# Patient Record
Sex: Male | Born: 1980 | Race: Black or African American | Hispanic: No | Marital: Single
Health system: Midwestern US, Community
[De-identification: ages and names within clinical notes are randomized; demographics above are authoritative.]

## PROBLEM LIST (undated history)

## (undated) DIAGNOSIS — M1990 Unspecified osteoarthritis, unspecified site: Secondary | ICD-10-CM

---

## 2013-09-07 ENCOUNTER — Inpatient Hospital Stay: Admit: 2013-09-07 | Discharge: 2013-09-07 | Disposition: A | Discharge status: Home / Self Care

## 2013-09-07 ENCOUNTER — Emergency Department: Admit: 2013-09-07

## 2013-09-07 DIAGNOSIS — N453 Epididymo-orchitis: Secondary | ICD-10-CM

## 2013-09-07 LAB — URINALYSIS-MACROSCOPIC W/REFLEX TO MICROSCOPIC
Bilirubin, UA: NEGATIVE
Glucose, UA: NEGATIVE mg/dL
Ketones, UA: NEGATIVE mg/dL
Nitrite, UA: NEGATIVE
Protein, UA: NEGATIVE mg/dL
Specific Gravity, UA: 1.01 (ref 1.005–1.035)
Urobilinogen, UA: 0.2 EU/dL (ref 0.2–1.0)
pH, UA: 7 (ref 5.0–8.0)

## 2013-09-07 LAB — URINALYSIS, MICROSCOPIC
RBC, UA: 3 /HPF (ref 0–3)
Squam Epithel, UA: <1 /HPF (ref 0–5)
WBC, UA: 47 /HPF (ref 0–5)

## 2013-09-07 MED ORDER — doxycycline (VIBRA-TABS) 100 MG tablet
100 | ORAL_TABLET | Freq: Two times a day (BID) | ORAL | Status: AC
Start: 2013-09-07 — End: 2013-09-21

## 2013-09-07 MED ORDER — HYDROcodone-acetaminophen (NORCO) 5-325 mg per tablet
5-325 | ORAL_TABLET | Freq: Four times a day (QID) | ORAL | 0.00 refills | 15.50000 days | Status: AC | PRN
Start: 2013-09-07 — End: ?

## 2013-09-07 MED ORDER — cefTRIAXone (ROCEPHIN) injection 250 mg
250 | Freq: Once | INTRAMUSCULAR | Status: AC
Start: 2013-09-07 — End: 2013-09-07
  Administered 2013-09-07: 17:00:00 250 mg via INTRAMUSCULAR

## 2013-09-07 MED ORDER — oxyCODONE-acetaminophen (PERCOCET) 5-325 mg per tablet
5-325 | ORAL | Status: AC
Start: 2013-09-07 — End: 2013-09-07
  Administered 2013-09-07: 14:00:00 1 via ORAL

## 2013-09-07 MED ORDER — metroNIDAZOLE (FLAGYL) tablet 2,000 mg
500 | Freq: Once | ORAL | Status: AC
Start: 2013-09-07 — End: 2013-09-07
  Administered 2013-09-07: 17:00:00 2000 mg via ORAL

## 2013-09-07 MED ORDER — azithromycin (ZITHROMAX) powder 1 g
1 | Freq: Once | ORAL | Status: AC
Start: 2013-09-07 — End: 2013-09-07
  Administered 2013-09-07: 17:00:00 1 g via ORAL

## 2013-09-07 MED ORDER — doxycycline (MONODOX) capsule 100 mg
100 | Freq: Once | ORAL | Status: AC
Start: 2013-09-07 — End: 2013-09-07
  Administered 2013-09-07: 17:00:00 100 mg via ORAL

## 2013-09-07 MED ORDER — oxyCODONE-acetaminophen (PERCOCET) 5-325 mg per tablet 1 tablet
5-325 | Freq: Once | ORAL | Status: AC
Start: 2013-09-07 — End: 2013-09-07

## 2013-09-07 MED FILL — METRONIDAZOLE 500 MG TABLET: 500 500 MG | ORAL | Qty: 4

## 2013-09-07 MED FILL — OXYCODONE-ACETAMINOPHEN 5 MG-325 MG TABLET: 5-325 5-325 mg | ORAL | Qty: 1

## 2013-09-07 MED FILL — AZITHROMYCIN 1 GRAM ORAL PACKET: 1 1 gram | ORAL | Qty: 1

## 2013-09-07 MED FILL — CEFTRIAXONE 250 MG SOLUTION FOR INJECTION: 250 250 mg | INTRAMUSCULAR | Qty: 250

## 2013-09-07 MED FILL — DOXYCYCLINE MONOHYDRATE 100 MG CAPSULE: 100 100 MG | ORAL | Qty: 1

## 2013-09-07 NOTE — Unmapped (Signed)
[place] ED Note    Date of service:  09/07/2013    Reason for Visit: Testicle Swelling      Patient History     HPI  33 year old male presents to the emergency department with testicle swelling.  The patient states that he noticed over the last 2 days swelling in his right testicle associated with pain.  He denies dysuria change in urine color pain with defecation or urination.  He denies fevers.  He states he's never experienced this before but now endorses 8 of 10 right-sided testicular pain that feels like severe pain.  It does not radiate.  The pain is constant but worse with walking and no palliative factors.  The patient states that he is sexually active with women only and has within the last 4 weeks been sexually active without barrier protection.  The patient denies trauma to the area.   History reviewed. No pertinent past medical history.    History reviewed. No pertinent past surgical history.    Patient  reports that he has been smoking.  He does not have any smokeless tobacco history on file. He reports that he drinks alcohol. He reports that he does not use illicit drugs.      Previous Medications    No medications on file       Allergies:   Allergies as of 09/07/2013   ??? (No Known Allergies)       Review of Systems     Review of Systems  All other systems reviewed and negative other than as indicated in the HPI      Physical Exam     ED Triage Vitals   Vital Signs Group      Temp 09/07/13 0940 98.5 ??F (36.9 ??C)      Temp Source 09/07/13 0940 Oral      Heart Rate 09/07/13 0940 94      Heart Rate Source 09/07/13 0940 Automatic      Resp 09/07/13 0940 12      SpO2 09/07/13 0940 98 %      BP 09/07/13 0940 142/83 mmHg      BP Location 09/07/13 0940 Right arm      BP Method 09/07/13 0940 Automatic      Patient Position 09/07/13 0940 Lying   SpO2 09/07/13 0940 98 %   O2 Device 09/07/13 0940 None (Room air)       Physical Exam   Nursing note and vitals  reviewed.  Constitutional: He appears well-developed and well-nourished. No distress.   HENT:   Head: Normocephalic.   Eyes: Pupils are equal, round, and reactive to light.   Neck: Normal range of motion.   Cardiovascular: Normal rate and regular rhythm.    Pulmonary/Chest: Effort normal. No respiratory distress.   Abdominal: Soft. He exhibits no distension.   Genitourinary:   The patient has normal appearing secondary sexual characteristics without inguinal lymphadenopathy excoriations or skin changes.  The penis is palpated without tenderness to palpation and there is no discharge at the urethral meatus.  The right testicle is palpated with exquisite tenderness to palpation throughout the testicle and in the epididymal region with associated swelling.  There is no redness or fluctuance   Neurological: He is oriented to person, place, time and situation.    Skin: He is not diaphoretic.         Diagnostic Studies     Labs:  Urinalysis reveals 47 white blood cells    Radiology:  The  testicles are normal and symmetric in size, contour, and echogenicity. The right testicle measures 4.6 x 3.5 x 2.6 cm. The left testicle measures 4.9 x 3.1 x 2.5 cm. No focal testicular masses are identified. The left epididymis is heterogeneous, enlarged, and has markedly increased flow within it. There is a 3 mm hypoechoic structure in the left epididymis likely a spermatocele or epididymal cyst. Normal arterial and venous flow is demonstrated in the testicles bilaterally. There is a small right hydrocele.   IMPRESSION:   Right sided epididymitis. No evidence of torsion.      EKG:    No EKG Performed    Emergency Department Procedures     Procedures    ED Course and MDM     Derek Bolton is a 33 y.o. male who presented to the emergency department with Testicle Swelling    The patient presents to the emergency department with 2 days of right-sided testicular swelling and pain with physical exam signs concerning for epididymitis versus  testicular torsion for which an ultrasound was ordered which revealed no signs of torsion but did confirm right-sided epididymitis.  The patient was treated for sexual transmitted infections with azithromycin, Flagyl, and Rocephin and was given a two-week prescription for doxycycline as well as strict return precautions in discharge instructions and was discharged from the emergency department if condition with a diagnosis of epididymitis      Critical Care Time (Attendings)           Noberto Retort, MD  Resident  09/08/13 [phone]

## 2013-09-07 NOTE — Unmapped (Addendum)
Pt to main ultra sound   Ultra sound on his testicles

## 2013-09-07 NOTE — Unmapped (Signed)
Please take antibiotics as prescribed for full course and return to the emergency department with worsened pain, inability to urinate, any new concerns.

## 2013-09-07 NOTE — Unmapped (Signed)
EIP completed HIV test, result negative.

## 2013-09-07 NOTE — Unmapped (Signed)
Right testicle swelling since yesterday. Pain goes up into abd. No nausea and vomiting.

## 2013-09-07 NOTE — Unmapped (Signed)
ED Attending Attestation Note    Date of service:  09/07/2013    This patient was seen by the resident physician.  I have seen and examined the patient, agree with the workup, evaluation, management and diagnosis. The care plan has been discussed and I concur.     My assessment reveals a 33 y.o. male right testicle pain for 2 days increase the last 24 hours.  He is tender in his right testicle mostly posteriorly no Bell clapper  deformity.

## 2014-08-31 ENCOUNTER — Encounter: Attending: Sports Medicine

## 2014-09-01 ENCOUNTER — Encounter: Attending: Family Medicine | Primary: Family Medicine

## 2014-09-02 NOTE — Telephone Encounter (Signed)
Please call patient and ask if they would like to reschedule an appointment or if we can assist in any other way. Please leave message and front desk phone [phone] and then to press option 1 and then option 0.  Akeen Ledyard, R.N., MSN

## 2014-09-02 NOTE — Telephone Encounter (Signed)
Pt had appt   - failed to show 09/01/2014 @ 4.00

## 2014-09-03 NOTE — Telephone Encounter (Signed)
Left voice mail call to reschedule the appoint please call front desk [phone] press option 1 then option 0

## 2014-09-07 NOTE — Telephone Encounter (Signed)
Message reviewed - no additional instructions. Amillia Biffle. RN, MSN

## 2014-10-31 ENCOUNTER — Inpatient Hospital Stay
Admit: 2014-10-31 | Discharge: 2014-10-31 | Disposition: A | Discharge status: Home / Self Care | Payer: PRIVATE HEALTH INSURANCE

## 2014-10-31 DIAGNOSIS — M25562 Pain in left knee: Secondary | ICD-10-CM

## 2014-10-31 MED ORDER — ibuprofen (ADVIL,MOTRIN) 600 MG tablet
600 | ORAL_TABLET | Freq: Four times a day (QID) | ORAL | Status: AC
Start: 2014-10-31 — End: ?

## 2014-10-31 MED ORDER — ibuprofenADVILMOTRINtablet800mg
400 | Freq: Once | ORAL | Status: AC
Start: 2014-10-31 — End: 2014-10-31
  Administered 2014-10-31: 22:00:00 800 mg via ORAL

## 2014-10-31 MED FILL — IBUPROFEN 400 MG TABLET: 400 400 MG | ORAL | Qty: 2

## 2014-10-31 NOTE — Unmapped (Signed)
Date of Service: ??10/31/2014    The patient was seen and examined with the resident physician.  I have reviewed the notes, assessments, plan and/or procedures.  The case was discussed with the resident physician and I agree with their documentation.    Assessment reveals evidence of some joint swelling in the left knee but no warmth redness no evidence of septic arthritis.Marland Kitchen
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
## 2014-11-12 ENCOUNTER — Inpatient Hospital Stay: Admit: 2014-11-12 | Payer: PRIVATE HEALTH INSURANCE | Attending: Sports Medicine

## 2014-11-12 ENCOUNTER — Ambulatory Visit: Admit: 2014-11-12 | Discharge: 2014-11-12 | Payer: PRIVATE HEALTH INSURANCE

## 2014-11-12 DIAGNOSIS — M1712 Unilateral primary osteoarthritis, left knee: Secondary | ICD-10-CM

## 2014-11-12 DIAGNOSIS — M25562 Pain in left knee: Secondary | ICD-10-CM

## 2014-11-12 MED ORDER — naproxen (NAPROSYN) 500 MG tablet
500 | ORAL_TABLET | Freq: Two times a day (BID) | ORAL | Status: AC
Start: 2014-11-12 — End: ?
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
## 2014-11-12 NOTE — Unmapped (Signed)
Take the Naprosyn twice a day  Wear Ace wrap as needed  Call and schedule physical therapy  Return in 6 weeks for recheck  It's important to weight-bear as tolerated and to work on range of motion, see you do not get stiff knee

## 2014-11-19 NOTE — Unmapped (Signed)
Derek Bolton no showed for PT evaluation today

## 2015-08-11 ENCOUNTER — Emergency Department: Admit: 2015-08-11 | Payer: PRIVATE HEALTH INSURANCE

## 2015-08-11 ENCOUNTER — Emergency Department: Payer: PRIVATE HEALTH INSURANCE

## 2015-08-11 ENCOUNTER — Inpatient Hospital Stay
Admit: 2015-08-11 | Discharge: 2015-08-12 | Disposition: A | Discharge status: Home / Self Care | Payer: PRIVATE HEALTH INSURANCE

## 2015-08-11 DIAGNOSIS — S31134A Puncture wound of abdominal wall without foreign body, left lower quadrant without penetration into peritoneal cavity, initial encounter: Secondary | ICD-10-CM

## 2015-08-11 LAB — CBC
Hematocrit: 39.4 % (ref 38.5–50.0)
Hemoglobin: 12.8 g/dL — ABNORMAL LOW (ref 13.2–17.1)
MCH: 29.7 pg (ref 27.0–33.0)
MCHC: 32.5 g/dL (ref 32.0–36.0)
MCV: 91.2 fL (ref 80.0–100.0)
MPV: 8.8 fL (ref 7.5–11.5)
Platelets: 174 10E3/uL (ref 140–400)
RBC: 4.33 10E6/uL (ref 4.20–5.80)
RDW: 16.3 % — ABNORMAL HIGH (ref 11.0–15.0)
WBC: 13.4 10E3/uL — ABNORMAL HIGH (ref 3.8–10.8)

## 2015-08-11 LAB — ED BLOOD GAS PANEL, VENOUS
%HBO2, Venous: 81.4 % — ABNORMAL HIGH (ref 40.0–70.0)
Base Excess, Ven: -0.3 mmol/L (ref <=3.0)
CO2 Content, Venous: 26 mmol/L (ref 25–29)
Carboxyhemoglobin, Venous: 8.6 % — ABNORMAL HIGH (ref 0.0–2.0)
Free Calcium, WB: 4.66 mg/dL (ref 4.50–5.30)
Glucose: 115 mg/dL — ABNORMAL HIGH (ref 70–100)
HCO3, Ven: 25 mmol/L (ref 24–28)
Hematocrit. Blood Gas Panel: 40.1 % (ref 40–52)
Hemoglobin, Blood Gas Panel: 13.1 g/dL — ABNORMAL LOW (ref 14.0–18.0)
Lactate, Ven: 1.9 mmol/L — ABNORMAL HIGH (ref 0.5–1.6)
Methemoglobin, Venous: 0.6 % (ref 0.0–1.5)
Potassium: 3.8 mmol/L (ref 3.5–5.3)
Reduced hemoglobin, Venous: 9.4 % — ABNORMAL HIGH (ref 0.0–5.0)
Sodium: 145 mmol/L (ref 136–146)
pCO2, Ven: 43 mmHg (ref 41–51)
pH, Ven: 7.37 (ref 7.32–7.42)
pO2, Ven: 60 mmHg — ABNORMAL HIGH (ref 25–40)

## 2015-08-11 LAB — CREATININE, SERUM
Creatinine: 0.88 mg/dL (ref 0.60–1.30)
eGFR AA CKD-EPI: >90 See note.
eGFR NONAA CKD-EPI: >90 See note.

## 2015-08-11 LAB — RAPID TEG
TEG ACT: 113 s (ref 86.0–118.0)
TEG Angle: 78.2 degrees (ref 64–80)
TEG Lysis 30: 0.3 %
TEG Max Amplitude: 67.8 mm (ref 52–71)
TEG R Time: 40 s (ref 22–44)
TEG Time: 70 s (ref 34–138)

## 2015-08-11 LAB — BUN: BUN: 7 mg/dL (ref 7–25)

## 2015-08-11 LAB — ETHANOL, SERUM: Ethanol: 376 mg/dL — ABNORMAL HIGH (ref 0–10)

## 2015-08-11 LAB — POCT INR: Prothrombin Time INR, POC: 1.4 (ref 0.8–1.4)

## 2015-08-11 LAB — ABO/RH: Rh Type: POSITIVE

## 2015-08-11 LAB — ANTIBODY SCREEN: Antibody Screen: NEGATIVE

## 2015-08-11 MED ORDER — diphth,pertus(acell),tetanus (BOOSTRIX TDAP) 2.5-8-5 Lf-mcg-Lf/0.5mL syringe Syrg
2.5-8-5 | INTRAMUSCULAR | Status: AC
Start: 2015-08-11 — End: ?

## 2015-08-11 MED ORDER — OMNIPAQUE (iohexol) 350 mg iodine/mL 150 mL
350 | Freq: Once | INTRAVENOUS | Status: AC | PRN
Start: 2015-08-11 — End: 2015-08-11
  Administered 2015-08-11: 150 mL via INTRAVENOUS

## 2015-08-11 MED ORDER — fentaNYL (SUBLIMAZE) 50 mcg/mL injection
50 | INTRAMUSCULAR | Status: AC
Start: 2015-08-11 — End: ?

## 2015-08-11 MED FILL — FENTANYL (PF) 50 MCG/ML INJECTION SOLUTION: 50 50 mcg/mL | INTRAMUSCULAR | Qty: 2

## 2015-08-11 MED FILL — OMNIPAQUE 350 MG IODINE/ML INTRAVENOUS SOLUTION: 350 350 mg iodine/mL | INTRAVENOUS | Qty: 150

## 2015-08-11 MED FILL — BOOSTRIX TDAP 2.5 LF UNIT-8 MCG-5 LF/0.5 ML INTRAMUSCULAR SYRINGE: 2.5-8-5 2.5-8-5 Lf-mcg-Lf/0.5mL | INTRAMUSCULAR | Qty: 0.5
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
## 2015-08-11 NOTE — Unmapped (Signed)
Please see trauma flow sheet for previous information.
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
## 2015-08-12 NOTE — Unmapped (Signed)
RN requested shirt for patient.  SW provided large t-shirt prior to discharge.    Elliot Gault MSW LSW

## 2015-11-19 ENCOUNTER — Encounter: Admit: 2015-11-20 | Primary: Family Medicine

## 2015-11-19 DIAGNOSIS — R079 Chest pain, unspecified: Secondary | ICD-10-CM

## 2015-11-19 NOTE — ED Triage Notes (Signed)
C/o left chest pain started PTA states its been on and off for about a week. Pt states he just recently moved here and hasnt been taking his BP medications

## 2015-11-19 NOTE — ED Provider Notes (Signed)
Triage Chief Complaint:   Chest Pain    HOPI:  Gary Terry is a 35 y.o. male that presents to the ED for evaluation of midsternal chest pain that is sharp, constant, 8 out of 10, and radiates to the left chest.  Symptoms started just prior to arrival while he was sitting down.  Denies associated shortness of breath.  Denies fever, chills, sweats, cough, congestion, abdominal pain, nausea, vomiting, diarrhea, constipation, dysuria, hematuria, headache, dizziness, lightheadedness, weakness, tingling, or numbness.  Denies any exacerbating alleviating factors.  States he recently moved here about a month and a half ago and has not yet found a PCP who prescribed him his blood pressure medications.  He has not been taking his medications because he ran out of a half ago.  States.  Did not take any medications for control prior to arrival.  Review of systems otherwise negative.    ROS:  General:  No fevers, no chills, no weakness  Eyes:  No recent vison changes, no discharge  ENT:  No sore throat, no nasal congestion, no hearing changes  Cardiovascular:  + chest pain, no palpitations  Respiratory:  No shortness of breath, no cough, no wheezing  Gastrointestinal:  No pain, no nausea, no vomiting, no diarrhea  Musculoskeletal:  No muscle pain, no joint pain  Skin:  No rash, no pruritis, no easy bruising  Neurologic:  No speech problems, no headache, no extremity numbness, no extremity tingling, no extremity weakness  Psychiatric:  No anxiety  Genitourinary:  No dysuria, no hematuria  Endocrine:  No unexpected weight gain, no unexpected weight loss  Extremities:  no edema, no pain    Past Medical History:   Diagnosis Date   ??? Hypertension      Past Surgical History:   Procedure Laterality Date   ??? HEMORRHOID SURGERY       History reviewed. No pertinent family history.  Social History     Social History   ??? Marital status: Single     Spouse name: N/A   ??? Number of children: N/A   ??? Years of education: N/A     Occupational  History   ??? Not on file.     Social History Main Topics   ??? Smoking status: Not on file   ??? Smokeless tobacco: Not on file   ??? Alcohol use Not on file   ??? Drug use: Not on file   ??? Sexual activity: Not on file     Other Topics Concern   ??? Not on file     Social History Narrative   ??? No narrative on file     No current facility-administered medications for this encounter.      Current Outpatient Prescriptions   Medication Sig Dispense Refill   ??? calcium carbonate (ANTACID) 500 MG chewable tablet Take 1 tablet by mouth daily 30 tablet 0   ??? aspirin (ASPIRIN CHILDRENS) 81 MG chewable tablet Take 1 tablet by mouth daily 30 tablet 0     Allergies no known allergies    Nursing Notes Reviewed    Physical Exam:  ED Triage Vitals   Enc Vitals Group      BP 11/19/15 2139 144/91      Pulse 11/19/15 2139 75      Resp 11/19/15 2139 15      Temp 11/19/15 2139 98.2 ??F (36.8 ??C)      Temp Source 11/19/15 2139 Oral      SpO2 11/19/15 2139 99 %  Weight 11/19/15 2139 170 lb (77.1 kg)      Height 11/19/15 2139 6\' 1"  (1.854 m)      Head Cir --       Peak Flow --       Pain Score --       Pain Loc --       Pain Edu? --       Excl. in GC? --        My pulse ox interpretation is ??? normal    General appearance:  No acute distress.   Skin:  Warm. Dry.   Eye:  Extraocular movements intact.     Ears, nose, mouth and throat:  Oral mucosa moist   Neck:  Trachea midline.   Extremity:  No swelling.  Normal ROM     Heart:  Regular rate and rhythm, normal S1 & S2, no extra heart sounds.    Perfusion:  intact  Respiratory:  Lungs clear to auscultation bilaterally.  Respirations nonlabored.     Abdominal:  Normal bowel sounds.  Soft.  Nontender.  Non distended.  Back:  No CVA tenderness to palpation     Neurological:  Alert and oriented times 3.  No focal neuro deficits.             Psychiatric:  Appropriate    I have reviewed and interpreted all of the currently available lab results from this visit (if applicable):  Results for orders placed or  performed during the hospital encounter of 11/19/15   CBC Auto Differential   Result Value Ref Range    WBC 8.8 4.0 - 10.5 K/CU MM    RBC 3.84 (L) 4.6 - 6.2 M/CU MM    Hemoglobin 11.6 (L) 13.5 - 18.0 GM/DL    Hematocrit 16.1 (L) 42 - 52 %    MCV 90.4 78 - 100 FL    MCH 30.2 27 - 31 PG    MCHC 33.4 32.0 - 36.0 %    RDW 14.2 11.7 - 14.9 %    Platelets 165 140 - 440 K/CU MM    MPV 10.9 7.5 - 11.1 FL    Differential Type AUTOMATED DIFFERENTIAL     Segs Relative 56.1 36 - 66 %    Lymphocytes % 29.2 24 - 44 %    Monocytes % 9.0 (H) 0 - 4 %    Eosinophils % 5.0 (H) 0 - 3 %    Basophils % 0.5 0 - 1 %    Segs Absolute 4.9 K/CU MM    Lymphocytes # 2.6 K/CU MM    Monocytes # 0.8 K/CU MM    Eosinophils # 0.4 K/CU MM    Basophils # 0.0 K/CU MM    Nucleated RBC % 0.0 %    Total Nucleated RBC 0.0 K/CU MM    Total Immature Neutrophil 0.02 K/CU MM    Immature Neutrophil % 0.2 0 - 0.43 %   Comprehensive Metabolic Panel   Result Value Ref Range    Sodium 138 135 - 145 MMOL/L    Potassium 4.1 3.5 - 5.1 MMOL/L    Chloride 100 99 - 110 mMol/L    CO2 27 21 - 32 MMOL/L    BUN 13 6 - 23 MG/DL    CREATININE 0.7 (L) 0.9 - 1.3 MG/DL    Glucose 82 70 - 096 MG/DL    Calcium 9.2 8.3 - 04.5 MG/DL    Alb 3.8 3.4 - 5.0 GM/DL  Total Protein 6.8 6.4 - 8.2 GM/DL    Total Bilirubin 0.3 0.0 - 1.0 MG/DL    ALT 17 10 - 40 U/L    AST 20 15 - 37 IU/L    Alkaline Phosphatase 99 40 - 129 IU/L    GFR Non-African American >60 >60 mL/min/1.14m2    GFR African American >60 >60 mL/min/1.53m2    Anion Gap 11 4 - 16   Troponin   Result Value Ref Range    Troponin T <0.010 <0.01 NG/ML   Brain Natriuretic Peptide   Result Value Ref Range    Pro-BNP 22.94 <300 PG/ML   TSH without Reflex   Result Value Ref Range    TSH, High Sensitivity 1.490 0.270 - 4.20 uIu/ml   Magnesium   Result Value Ref Range    Magnesium 1.9 1.8 - 2.4 mg/dl   Phosphorus   Result Value Ref Range    Phosphorus 3.8 2.5 - 4.9 MG/DL   D-Dimer, Quantitative   Result Value Ref Range    D-Dimer, Quant  <200 <230 NG/mL(DDU)   Troponin   Result Value Ref Range    Troponin T <0.010 <0.01 NG/ML      Radiographs (if obtained):  []  The following radiograph was interpreted by myself in the absence of a radiologist:   []  Radiologist's Report Reviewed:  XR Chest Portable   Final Result   Borderline cardiomegaly with left ventricular configuration.      Clear lungs.               EKG (if obtained): (All EKG's are interpreted by myself in the absence of a cardiologist) normal sinus rhythm without evidence of acute ischemia. No previous EKG available for comparison.     Chart review shows recent radiographs:  Xr Chest Portable    Result Date: 11/19/2015  EXAMINATION: SINGLE VIEW OF THE CHEST 11/19/2015 9:51 pm COMPARISON: None. HISTORY: ORDERING SYSTEM PROVIDED HISTORY: chest pain TECHNOLOGIST PROVIDED HISTORY: Ordering Physician Provided Reason for Exam: chest pain Acuity: Acute Type of Encounter: Initial Additional signs and symptoms: C/o left chest pain started PTA states its been on and off for about a week. Pt states he just recently moved here and hasnt been taking his BP medications Relevant Medical/Surgical History: htn FINDINGS: The heart is borderline enlarged with left ventricular configuration.  The lungs are clear.  Pulmonary vasculature is within normal limits.  No pneumothorax or pleural effusion.  No acute bone finding.     Borderline cardiomegaly with left ventricular configuration. Clear lungs.       MDM:  34YOM presents to the ED for evaluation of acute of chest pain. He has no acute findings on physical exam.  He uses tobacco, and has hypertension.  No history of coronary artery disease in first-degree relatives.  No diabetes or high cholesterol.  His HEART score is 2, and he is low risk for a major cardiac event from acute syndrome.  He does PERC out.  Today for evaluation, I have ordered EKG, chest x-ray, CBC, CMP, troponin, BNP, TSH, magnesium level, phosphorus, d-dimer, and repeat troponin after 3 hours.   He was given aspirin and nitroglycerin for pain, which did not alleviate his symptoms.  However, he was given Maalox, viscous lidocaine, and Flexeril, which did alleviate his symptoms.  His laboratory and imaging results are as above.  Repeat troponin at 3 hours was unchanged and negative.  Given findings, he was given prescription for aspirin and antacid, strict return precautions, and was  discharged home in stable condition to establish PCP care with Dr. Gale Journey and cardiology care with Dr. Tasia Catchings for follow up as soon as possible.     Clinical Impression:  1. Chest pain, unspecified type      Disposition referral (if applicable):  August Saucer, MD  [address]  [place] [address]  [phone]    Schedule an appointment as soon as possible for a visit      Christiane Ha, MD  [address] [place] [address]  [phone]    Schedule an appointment as soon as possible for a visit      Disposition medications (if applicable):  Discharge Medication List as of 11/20/2015  1:35 AM      START taking these medications    Details   calcium carbonate (ANTACID) 500 MG chewable tablet Take 1 tablet by mouth daily, Disp-30 tablet, R-0Print      aspirin (ASPIRIN CHILDRENS) 81 MG chewable tablet Take 1 tablet by mouth daily, Disp-30 tablet, R-0Print             Comment: Please note this report has been produced using speech recognition software and may contain errors related to that system including errors in grammar, punctuation, and spelling, as well as words and phrases that may be inappropriate. If there are any questions or concerns please feel free to contact the dictating provider for clarification.      Veneda Melter, MD  11/20/15 [phone]

## 2015-11-20 ENCOUNTER — Inpatient Hospital Stay: Admit: 2015-11-20 | Discharge: 2015-11-20 | Attending: Emergency Medicine

## 2015-11-20 LAB — COMPREHENSIVE METABOLIC PANEL
ALT: 17 U/L (ref 10–40)
AST: 20 IU/L (ref 15–37)
Albumin: 3.8 GM/DL (ref 3.4–5.0)
Alkaline Phosphatase: 99 IU/L (ref 40–129)
Anion Gap: 11 (ref 4–16)
BUN: 13 MG/DL (ref 6–23)
CO2: 27 MMOL/L (ref 21–32)
Calcium: 9.2 MG/DL (ref 8.3–10.6)
Chloride: 100 mMol/L (ref 99–110)
Creatinine: 0.7 MG/DL — ABNORMAL LOW (ref 0.9–1.3)
GFR African American: >60 mL/min/{1.73_m2} (ref >60)
GFR Non-African American: >60 mL/min/{1.73_m2} (ref >60)
Glucose: 82 MG/DL (ref 70–140)
Potassium: 4.1 MMOL/L (ref 3.5–5.1)
Sodium: 138 MMOL/L (ref 135–145)
Total Bilirubin: 0.3 MG/DL (ref 0.0–1.0)
Total Protein: 6.8 GM/DL (ref 6.4–8.2)

## 2015-11-20 LAB — CBC WITH AUTO DIFFERENTIAL
Basophils %: 0.5 % (ref 0–1)
Basophils Absolute: 0 10*3/uL
Eosinophils %: 5 % — ABNORMAL HIGH (ref 0–3)
Eosinophils Absolute: 0.4 10*3/uL
Hematocrit: 34.7 % — ABNORMAL LOW (ref 42–52)
Hemoglobin: 11.6 GM/DL — ABNORMAL LOW (ref 13.5–18.0)
Immature Neutrophil %: 0.2 % (ref 0–0.43)
Lymphocytes %: 29.2 % (ref 24–44)
Lymphocytes Absolute: 2.6 10*3/uL
MCH: 30.2 PG (ref 27–31)
MCHC: 33.4 % (ref 32.0–36.0)
MCV: 90.4 FL (ref 78–100)
MPV: 10.9 FL (ref 7.5–11.1)
Monocytes %: 9 % — ABNORMAL HIGH (ref 0–4)
Monocytes Absolute: 0.8 10*3/uL
Nucleated RBC %: 0 %
Platelets: 165 10*3/uL (ref 140–440)
RBC: 3.84 10*6/uL — ABNORMAL LOW (ref 4.6–6.2)
RDW: 14.2 % (ref 11.7–14.9)
Segs Absolute: 4.9 10*3/uL
Segs Relative: 56.1 % (ref 36–66)
Total Immature Neutrophil: 0.02 10*3/uL
Total Nucleated RBC: 0 10*3/uL
WBC: 8.8 10*3/uL (ref 4.0–10.5)

## 2015-11-20 LAB — TROPONIN
Troponin T: <0.01 NG/ML (ref <0.01)
Troponin T: <0.01 NG/ML (ref <0.01)

## 2015-11-20 LAB — PHOSPHORUS: Phosphorus: 3.8 MG/DL (ref 2.5–4.9)

## 2015-11-20 LAB — BRAIN NATRIURETIC PEPTIDE: Pro-BNP: 22.94 PG/ML (ref <300)

## 2015-11-20 LAB — MAGNESIUM: Magnesium: 1.9 mg/dl (ref 1.8–2.4)

## 2015-11-20 LAB — TSH: TSH, High Sensitivity: 1.49 u[IU]/mL (ref 0.270–4.20)

## 2015-11-20 LAB — D-DIMER, QUANTITATIVE: D-Dimer, Quant: <200 NG/mL(DDU) (ref <230)

## 2015-11-20 MED ORDER — NITROGLYCERIN 0.4 MG SL SUBL
0.4 MG | Freq: Once | SUBLINGUAL | Status: AC
Start: 2015-11-20 — End: 2015-11-19
  Administered 2015-11-20: 03:00:00 0.4 mg via SUBLINGUAL

## 2015-11-20 MED ORDER — CALCIUM CARBONATE ANTACID 500 MG PO CHEW
500 MG | ORAL_TABLET | Freq: Every day | ORAL | 0 refills | Status: AC
Start: 2015-11-20 — End: 2015-12-20

## 2015-11-20 MED ORDER — LIDOCAINE VISCOUS 2 % MT SOLN
2 % | Freq: Once | OROMUCOSAL | Status: AC
Start: 2015-11-20 — End: 2015-11-20
  Administered 2015-11-20: 05:00:00 15 mL via OROMUCOSAL

## 2015-11-20 MED ORDER — ALUM & MAG HYDROXIDE-SIMETH 200-200-20 MG/5ML PO SUSP
200-200-20 MG/5ML | Freq: Once | ORAL | Status: AC
Start: 2015-11-20 — End: 2015-11-20
  Administered 2015-11-20: 05:00:00 30 mL via ORAL

## 2015-11-20 MED ORDER — CYCLOBENZAPRINE HCL 10 MG PO TABS
10 MG | Freq: Once | ORAL | Status: AC
Start: 2015-11-20 — End: 2015-11-20
  Administered 2015-11-20: 06:00:00 10 mg via ORAL

## 2015-11-20 MED ORDER — ASPIRIN 81 MG PO CHEW
81 MG | Freq: Once | ORAL | Status: AC
Start: 2015-11-20 — End: 2015-11-19
  Administered 2015-11-20: 03:00:00 324 mg via ORAL

## 2015-11-20 MED ORDER — ASPIRIN 81 MG PO CHEW
81 MG | ORAL_TABLET | Freq: Every day | ORAL | 0 refills | Status: DC
Start: 2015-11-20 — End: 2015-12-30

## 2015-11-20 MED FILL — NITROSTAT 0.4 MG SL SUBL: 0.4 MG | SUBLINGUAL | Qty: 25

## 2015-11-20 MED FILL — LIDOCAINE VISCOUS 2 % MT SOLN: 2 % | OROMUCOSAL | Qty: 15

## 2015-11-20 MED FILL — MAG-AL PLUS 200-200-20 MG/5ML PO LIQD: 200-200-20 MG/5ML | ORAL | Qty: 30

## 2015-11-20 MED FILL — ASPIRIN 81 MG PO CHEW: 81 MG | ORAL | Qty: 4

## 2015-11-20 MED FILL — CYCLOBENZAPRINE HCL 10 MG PO TABS: 10 MG | ORAL | Qty: 1

## 2015-12-30 ENCOUNTER — Encounter: Admit: 2015-12-30 | Primary: Family Medicine

## 2015-12-30 ENCOUNTER — Inpatient Hospital Stay: Admit: 2015-12-30 | Discharge: 2015-12-30 | Attending: Emergency Medicine

## 2015-12-30 DIAGNOSIS — S0083XA Contusion of other part of head, initial encounter: Secondary | ICD-10-CM

## 2015-12-30 MED ORDER — KETOROLAC TROMETHAMINE 30 MG/ML IJ SOLN
30 MG/ML | Freq: Once | INTRAMUSCULAR | Status: AC
Start: 2015-12-30 — End: 2015-12-30
  Administered 2015-12-30: 09:00:00 30 mg via INTRAVENOUS

## 2015-12-30 MED ORDER — LIDOCAINE HCL 1 % IJ SOLN
1 % | Freq: Once | INTRAMUSCULAR | Status: AC
Start: 2015-12-30 — End: 2015-12-30
  Administered 2015-12-30: 07:00:00 5 mL via INTRADERMAL

## 2015-12-30 MED ORDER — ONDANSETRON HCL 4 MG/2ML IJ SOLN
4 MG/2ML | INTRAMUSCULAR | Status: DC | PRN
Start: 2015-12-30 — End: 2015-12-30
  Administered 2015-12-30: 06:00:00 4 mg via INTRAVENOUS

## 2015-12-30 MED ORDER — MORPHINE SULFATE 4 MG/ML IJ SOLN
4 MG/ML | INTRAMUSCULAR | Status: DC | PRN
Start: 2015-12-30 — End: 2015-12-30
  Administered 2015-12-30: 06:00:00 4 mg via INTRAVENOUS

## 2015-12-30 MED ORDER — TETANUS-DIPHTHERIA TOXOIDS TD 2-2 LF/0.5ML IM SUSP
2-2 LF/0.5ML | Freq: Once | INTRAMUSCULAR | Status: AC
Start: 2015-12-30 — End: 2015-12-30
  Administered 2015-12-30: 06:00:00 0.5 mL via INTRAMUSCULAR

## 2015-12-30 MED FILL — LIDOCAINE HCL 1 % IJ SOLN: 1 % | INTRAMUSCULAR | Qty: 20

## 2015-12-30 MED FILL — ONDANSETRON HCL 4 MG/2ML IJ SOLN: 4 MG/2ML | INTRAMUSCULAR | Qty: 2

## 2015-12-30 MED FILL — KETOROLAC TROMETHAMINE 30 MG/ML IJ SOLN: 30 MG/ML | INTRAMUSCULAR | Qty: 1

## 2015-12-30 MED FILL — MORPHINE SULFATE 4 MG/ML IJ SOLN: 4 mg/mL | INTRAMUSCULAR | Qty: 1

## 2015-12-30 MED FILL — TETANUS-DIPHTHERIA TOXOIDS TD 2-2 LF/0.5ML IM SUSP: 2-2 LF/0.5ML | INTRAMUSCULAR | Qty: 0.5

## 2015-12-30 NOTE — ED Notes (Signed)
Riki Rusk PA at bedside.      Blair Dolphin, RN  12/30/15 [phone]

## 2015-12-30 NOTE — ED Notes (Signed)
Dr. Lawerance Bach at bedside.      Blair Dolphin, RN  12/30/15 [phone]
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
## 2015-12-30 NOTE — ED Notes (Signed)
SPD at bedside.      Blair Dolphin, RN  12/30/15 0111
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
## 2015-12-30 NOTE — ED Notes (Signed)
Pt to CT scan.      Blair Dolphin, RN  12/30/15 [phone]

## 2015-12-30 NOTE — Discharge Instructions (Signed)
Head Injury: Care Instructions  Your Care Instructions  Most injuries to the head are minor. Bumps, cuts, and scrapes on the head and face usually heal well and can be treated the same as injuries to other parts of the body.  Although it's rare, once in a while a more serious problem shows up after you are home. So it's good to be on the lookout for symptoms for a day or two.  Follow-up care is a key part of your treatment and safety. Be sure to make and go to all appointments, and call your doctor if you are having problems. It's also a good idea to know your test results and keep a list of the medicines you take.  How can you care for yourself at home?   Follow your doctor's instructions. He or she will tell you if you need someone to watch you closely for the next 24 hours or longer.   Take it easy for the next few days or more if you are not feeling well.   Ask your doctor when it's okay for you to go back to activities like driving a car, riding a bike, or operating machinery.  When should you call for help?  Call 911 anytime you think you may need emergency care. For example, call if:   You have a seizure.   You passed out (lost consciousness).   You are confused or can't stay awake.  Call your doctor now or seek immediate medical care if:   You have new or worse vomiting.   You feel less alert.   You have new weakness or numbness in any part of your body.  Watch closely for changes in your health, and be sure to contact your doctor if:   You do not get better as expected.   You have new symptoms, such as headaches, trouble concentrating, or changes in mood.  Where can you learn more?  Go to [email or web address] and sign in to your MyChart account. Enter [phone] in the Search Health Information box to learn more about "Head Injury: Care Instructions."     If you do not have an account, please click on the "Sign Up Now" link.  Current as of: January 29, 2015  Content Version:  11.3   2006-2017 [employer]. Care instructions adapted under license by [facility]. If you have questions about a medical condition or this instruction, always ask your healthcare professional. [employer], Inc[employer] disclaims any warranty or liability for your use of this information.       Concussion: Care Instructions  Your Care Instructions    A concussion is a kind of injury to the brain. It happens when the head receives a hard blow. The impact can jar or shake the brain against the skull. This interrupts the brain's normal activities. Although you may have cuts or bruises on your head or face, you may have no other visible signs of a brain injury. In most cases, damage to the brain from a concussion can't be seen in tests such as a CT or MRI scan.  For a few weeks, you may have low energy, dizziness, trouble sleeping, a headache, ringing in your ears, or nausea. You may also feel anxious, grumpy, or depressed. You may have problems with memory and concentration. These symptoms are common after a concussion. They should slowly improve over time. Sometimes this takes weeks or even months. Someone who lives with you should know how to care for you. Please share  this and all information with a caregiver who will be available to help if needed.  Follow-up care is a key part of your treatment and safety. Be sure to make and go to all appointments, and call your doctor if you are having problems. It's also a good idea to know your test results and keep a list of the medicines you take.  How can you care for yourself at home?  Pain control   Put ice or a cold pack on the part of your head that hurts for 10 to 20 minutes at a time. Put a thin cloth between the ice and your skin.   Be safe with medicines. Read and follow all instructions on the label.   If the doctor gave you a prescription medicine for pain, take it as prescribed.   If you are not taking a prescription pain medicine, ask your  doctor if you can take an over-the-counter medicine.  Recovery   Follow your doctor's instructions. He or she will tell you if you need someone to watch you closely for the next 24 hours or longer.   Rest is the best way to recover from a concussion. You need to rest your body and your brain:   Get plenty of sleep at night. And take rest breaks during the day.   Avoid activities that take a lot of physical or mental work. This includes housework, exercise, schoolwork, video games, text messaging, and using the computer.   You may need to change your school or work schedule while you recover.   Return to your normal activities slowly. Do not try to do too much at once.   Do not drink alcohol or use illegal drugs. Alcohol and illegal drugs can slow your recovery. And they can increase your risk of a second brain injury.   Avoid activities that could lead to another concussion. Follow your doctor's instructions for a gradual return to activity and sports.   Ask your doctor when it's okay for you to drive a car, ride a bike, or operate machinery.  How should you return to activity?  Your return to sports or activity should be gradual. It should only begin when all symptoms of a concussion are gone, both while at rest and during exercise or exertion.  Doctors and concussion specialists suggest steps to follow for returning to sports after a concussion. Use these steps as a guide. In most places, your doctor must give you written permission for your child to begin the steps and return to sports. You should slowly progress through the following levels of activity:  1. No activity. This means complete physical and mental rest.  2. Light aerobic activity. This can include walking, swimming, or other exercise at less than 70% of maximum heart rate. No resistance training is included in this step.  3. Sport-specific exercise. This includes running drills or skating drills (depending on the sport), but no head  impact.  4. Noncontact training drills. This includes more complex training drills such as passing. The athlete may also begin light resistance training.  5. Primary school teacher. The athlete can participate in normal training.  6. Return to normal game play. This is the final step and allows the athlete to join in normal game play.  Watch and keep track of your progress. It should take at least 6 days for you to go from light activity to normal game play.  Make sure that you can stay at each new level of  activity for at least 24 hours without symptoms, or as long as your doctor says, before doing more. If one or more symptoms come back, return to a lower level of activity for at least 24 hours. Don't move on until all symptoms are gone.  When should you call for help?  Call 911 anytime you think you may need emergency care. For example, call if:   You have a seizure.   You passed out (lost consciousness).   You are confused or can't stay awake.  Call your doctor now or seek immediate medical care if:   You have new or worse vomiting.   You feel less alert.   You have new weakness or numbness in any part of your body.  Watch closely for changes in your health, and be sure to contact your doctor if:   You do not get better as expected.   You have new symptoms, such as headaches, trouble concentrating, or changes in mood.  Where can you learn more?  Go to [email or web address] and sign in to your MyChart account. Enter [phone] in the Search Health Information box to learn more about "Concussion: Care Instructions."     If you do not have an account, please click on the "Sign Up Now" link.  Current as of: January 05, 2015  Content Version: 11.3   2006-2017 [employer]. Care instructions adapted under license by [facility]. If you have questions about a medical condition or this instruction, always ask your healthcare professional. [employer], [employer] disclaims any warranty  or liability for your use of this information.

## 2015-12-30 NOTE — ED Notes (Signed)
Provided patient with urinal as per request.     Brunilda Payor Eleftheria Taborn, RN  12/30/15 [phone]
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
## 2015-12-30 NOTE — ED Notes (Signed)
Patient refused to put ice on face, states he is already cold, provided with blankets. Patient's face is visibly swollen.     Brunilda Payormily M Quay Simkin, RN  12/30/15 [phone]

## 2015-12-30 NOTE — ED Notes (Signed)
Rainbow sent to lab.      Blair Dolphinmy M Niang Mitcheltree, RN  12/30/15 0100

## 2015-12-30 NOTE — ED Notes (Signed)
Pt returned to room. Call light in reach.      Blair Dolphinmy M Natia Fahmy, RN  12/30/15 [phone]

## 2016-04-15 ENCOUNTER — Inpatient Hospital Stay
Admit: 2016-04-15 | Discharge: 2016-04-15 | Disposition: A | Discharge status: Home / Self Care | Payer: PRIVATE HEALTH INSURANCE | Attending: Emergency Medicine

## 2016-04-15 DIAGNOSIS — K644 Residual hemorrhoidal skin tags: Secondary | ICD-10-CM

## 2016-04-15 MED ORDER — HYDROCORTISONE 2.5 % RE CREA
2.5 % | RECTAL | 0 refills | Status: AC
Start: 2016-04-15 — End: ?

## 2016-04-15 MED ORDER — DOCUSATE SODIUM 100 MG PO CAPS
100 MG | ORAL_CAPSULE | Freq: Two times a day (BID) | ORAL | 0 refills | Status: AC
Start: 2016-04-15 — End: ?

## 2016-04-15 NOTE — ED Provider Notes (Signed)
[facility] ED     Emergency Department     Faculty Attestation    I performed a history and physical examination of the patient and discussed management with the resident. I reviewed the resident???s note and agree with the documented findings and plan of care. Any areas of disagreement are noted on the chart. I was personally present for the key portions of any procedures. I have documented in the chart those procedures where I was not present during the key portions. I have reviewed the emergency nurses triage note. I agree with the chief complaint, past medical history, past surgical history, allergies, medications, social and family history as documented unless otherwise noted below. For Physician Assistant/ Nurse Practitioner cases/documentation I have personally evaluated this patient and have completed at least one if not all key elements of the E/M (history, physical exam, and MDM). Additional findings are as noted.    Patient presents with a hemorrhoid.  He says he has noticed a couple of days ago.  He denies any blood on the toilet paper or in his stool.  He denies fever, chills, coughing, nausea or vomiting.  Treatment with Anusol and refer to surgery.      Melynda KellerJulie Ellie Bryand, MD  Attending Emergency  Physician              Jesus GeneraJulie C Maddock Finigan, MD  04/15/16 [phone]

## 2016-04-15 NOTE — ED Provider Notes (Signed)
[facility] ED  Emergency Department Encounter  Emergency Medicine Resident     Pt Name: Gary Terry  MRN: [id number]  Birthdate Aug 31, 1980  Date of evaluation: 04/15/16  PCP:  No primary care provider on file.    CHIEF COMPLAINT       Chief Complaint   Patient presents with   ??? Hemorrhoids     Pt to ED with c/o hemorroid for the past few months, had hemorrhoid surgery in February, no blood noted       HISTORY OF PRESENT ILLNESS  (Location/Symptom, Timing/Onset, Context/Setting, Quality, Duration, Modifying Factors, Severity.)      Gary Terry is a 35 y.o. male who presents with Hemorrhoid.  Patient states he has a history of external hemorrhoids which did require surgery in February of this year.  Patient states that over the past 2 months he's noticed a small hemorrhoid reemerge.  Patient states that it is mildly painful and pruritic.  Patient states that the pain is exacerbated by bowel movements.  Patient denies fever, chills, nausea, vomiting, abdominal pain, blood in stool, diarrhea, constipation.    PAST MEDICAL / SURGICAL / SOCIAL / FAMILY HISTORY      has a past medical history of Hypertension.     has a past surgical history that includes Hemorrhoid surgery.    Social History     Social History   ??? Marital status: Single     Spouse name: N/A   ??? Number of children: N/A   ??? Years of education: N/A     Occupational History   ??? Not on file.     Social History Main Topics   ??? Smoking status: Current Every Day Smoker     Packs/day: 0.50     Types: Cigarettes   ??? Smokeless tobacco: Not on file   ??? Alcohol use Yes      Comment: weekly   ??? Drug use: Yes     Types: Marijuana      Comment: sometimes   ??? Sexual activity: Not on file     Other Topics Concern   ??? Not on file     Social History Narrative   ??? No narrative on file       History reviewed. No pertinent family history.    Allergies:  Review of patient's allergies indicates no known allergies.    Home Medications:  Prior to Admission  medications    Medication Sig Start Date End Date Taking? Authorizing Provider   hydrocortisone (ANUSOL-HC) 2.5 % rectal cream Place rectally 2 times daily. 04/15/16  Yes Minoru Chap C Daril Warga, DO   docusate sodium (COLACE) 100 MG capsule Take 1 capsule by mouth 2 times daily 04/15/16  Yes Nikia Levels C Efrat Zuidema, DO       REVIEW OF SYSTEMS    (2-9 systems for level 4, 10 or more for level 5)      Review of Systems   Constitutional: Negative for chills and fever.   HENT: Negative for congestion, rhinorrhea, sinus pressure and sore throat.    Eyes: Negative for photophobia and visual disturbance.   Respiratory: Negative for cough and shortness of breath.    Cardiovascular: Negative for chest pain.   Gastrointestinal: Positive for rectal pain. Negative for abdominal pain, blood in stool, constipation, diarrhea, nausea and vomiting.   Genitourinary: Negative for dysuria and hematuria.   Musculoskeletal: Negative for back pain and neck pain.   Skin: Negative for rash.   Neurological: Negative for dizziness,  weakness, light-headedness, numbness and headaches.   Psychiatric/Behavioral: Negative for suicidal ideas.       PHYSICAL EXAM   (up to 7 for level 4, 8 or more for level 5)      INITIAL VITALS:   BP (!) 159/99    Pulse 91    Temp 97.5 ??F (36.4 ??C) (Oral)    Resp 16    SpO2 97%     Physical Exam   Constitutional: He is oriented to person, place, and time. He appears well-developed and well-nourished.   HENT:   Head: Normocephalic and atraumatic.   Right Ear: Tympanic membrane normal.   Left Ear: Tympanic membrane normal.   Eyes: EOM are normal. Pupils are equal, round, and reactive to light. No scleral icterus.   Neck: Normal range of motion. Neck supple. No JVD present.   Cardiovascular: Normal rate, regular rhythm, normal heart sounds and intact distal pulses.  Exam reveals no gallop and no friction rub.    No murmur heard.  Pulmonary/Chest: Effort normal and breath sounds normal. No respiratory distress. He has no  wheezes. He has no rales.   Abdominal: Soft. Bowel sounds are normal. He exhibits no distension and no mass. There is no tenderness. There is no rebound and no guarding.   Genitourinary:         Musculoskeletal: Normal range of motion. He exhibits no edema.   Lymphadenopathy:     He has no cervical adenopathy.   Neurological: He is alert and oriented to person, place, and time. GCS eye subscore is 4. GCS verbal subscore is 5. GCS motor subscore is 6.   Skin: Skin is warm and dry. No rash noted. He is not diaphoretic.   Psychiatric: He expresses no homicidal and no suicidal ideation.   Nursing note and vitals reviewed.      DIFFERENTIAL  DIAGNOSIS     PLAN (LABS / IMAGING / EKG):  No orders of the defined types were placed in this encounter.      MEDICATIONS ORDERED:  Orders Placed This Encounter   Medications   ??? hydrocortisone (ANUSOL-HC) 2.5 % rectal cream     Sig: Place rectally 2 times daily.     Dispense:  1 Tube     Refill:  0   ??? docusate sodium (COLACE) 100 MG capsule     Sig: Take 1 capsule by mouth 2 times daily     Dispense:  12 capsule     Refill:  0       DDX: External hemorrhoid, internal hemorrhoid, anal fissure    DIAGNOSTIC RESULTS / EMERGENCY DEPARTMENT COURSE / MDM     LABS:  No results found for this visit on 04/15/16.    IMPRESSION: The patient is a 35 year old male who is here for a hemorrhoid.  Patient has history of external hemorrhoids requiring surgery.  On examination there is a very small external hemorrhoid noted.  No labs or imaging at this time.    RADIOLOGY:  None    EKG  None    All EKG's are interpreted by the Emergency Department Physician who either signs or Co-signs this chart in the absence of a cardiologist.    EMERGENCY DEPARTMENT COURSE:  Patient be discharged with prescription for Anusol and Colace.  Patient states that he will take sitz baths as well.  Patient is given information [facility]  He  is instructed to  call and make an appointment, or return to the emergency department is any increase in symptoms or concerns.  Patient said he understood and agreed with plan.    PROCEDURES:  None    CONSULTS:  None    CRITICAL CARE:  None    FINAL IMPRESSION      1. External hemorrhoid          DISPOSITION / PLAN     DISPOSITION Decision To Discharge 04/15/2016 06:03:53 PM      PATIENT REFERRED TO:  [facility]  [address]  [place] [id number]  [phone]  Call   for appointment    [facility]  [address]  [place] [id number]  [phone]  Call   As needed      DISCHARGE MEDICATIONS:  New Prescriptions    DOCUSATE SODIUM (COLACE) 100 MG CAPSULE    Take 1 capsule by mouth 2 times daily    HYDROCORTISONE (ANUSOL-HC) 2.5 % RECTAL CREAM    Place rectally 2 times daily.       Sherron Alesimothy C Danissa Rundle, DO  Emergency Medicine Resident    (Please note that portions of this note were completed with a voice recognition program.  Efforts were made to edit the dictations but occasionally words are mis-transcribed.)     Sherron Alesimothy C Andrae Claunch, DO  Resident  04/15/16 [phone]
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
## 2016-09-10 ENCOUNTER — Emergency Department: Admit: 2016-09-11 | Payer: PRIVATE HEALTH INSURANCE | Primary: Family Medicine

## 2016-09-10 ENCOUNTER — Inpatient Hospital Stay
Admit: 2016-09-10 | Discharge: 2016-09-11 | Disposition: A | Discharge status: Home / Self Care | Payer: PRIVATE HEALTH INSURANCE | Attending: Emergency Medicine

## 2016-09-10 DIAGNOSIS — G8929 Other chronic pain: Secondary | ICD-10-CM

## 2016-09-10 NOTE — ED Notes (Signed)
Pt transported to xray by wheelchair     Laroy AppleEmily R Michiah Mudry, RN  09/10/16 2029

## 2016-09-10 NOTE — ED Notes (Signed)
Pt reporting he does not want or need crutches.     Laroy AppleEmily R Janna Oak, RN  09/10/16 2133

## 2016-09-10 NOTE — ED Notes (Signed)
Ice pack applied to left knee     Laroy Applemily R Alexxia Stankiewicz, RN  09/10/16 [phone]

## 2016-09-10 NOTE — ED Notes (Signed)
Pt resting on cart with call light within reach. NAD noted, RR even and NL. Will continue to monitor     Laroy Applemily R Chanel Mckesson, RN  09/10/16 2133
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
## 2016-09-10 NOTE — ED Notes (Signed)
Pt returned from xray      Laroy Applemily R Heather Streeper, [place]  09/10/16 2036
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
## 2016-09-11 MED ORDER — IBUPROFEN 800 MG PO TABS
800 MG | ORAL_TABLET | Freq: Three times a day (TID) | ORAL | 0 refills | Status: AC | PRN
Start: 2016-09-11 — End: ?

## 2016-09-11 MED ORDER — IBUPROFEN 800 MG PO TABS
800 MG | Freq: Once | ORAL | Status: AC
Start: 2016-09-11 — End: 2016-09-10
  Administered 2016-09-11: 800 mg via ORAL

## 2016-09-11 MED FILL — IBUPROFEN 800 MG PO TABS: 800 MG | ORAL | Qty: 1

## 2017-06-27 ENCOUNTER — Inpatient Hospital Stay
Admit: 2017-06-27 | Discharge: 2017-06-27 | Disposition: A | Discharge status: Home / Self Care | Payer: PRIVATE HEALTH INSURANCE | Attending: Emergency Medicine

## 2017-06-27 ENCOUNTER — Emergency Department: Admit: 2017-06-27 | Payer: PRIVATE HEALTH INSURANCE | Primary: Family Medicine

## 2017-06-27 DIAGNOSIS — S39012A Strain of muscle, fascia and tendon of lower back, initial encounter: Secondary | ICD-10-CM

## 2017-06-27 LAB — MICROSCOPIC URINALYSIS
Epithelial Cells UA: 0 /HPF (ref 0–5)
RBC, UA: 0 /HPF (ref 0–2)
WBC, UA: 0 /HPF (ref 0–5)

## 2017-06-27 LAB — URINALYSIS WITH REFLEX TO CULTURE
Bilirubin Urine: NEGATIVE
Glucose, Ur: NEGATIVE
Ketones, Urine: NEGATIVE
Leukocyte Esterase, Urine: NEGATIVE
Nitrite, Urine: NEGATIVE
Protein, UA: NEGATIVE
Specific Gravity, UA: <1.005 — ABNORMAL LOW (ref 1.010–1.020)
Urine Hgb: NEGATIVE
Urobilinogen, Urine: NORMAL
pH, UA: 7 (ref 5.0–9.0)

## 2017-06-27 MED ORDER — KETOROLAC TROMETHAMINE 30 MG/ML IJ SOLN
30 MG/ML | Freq: Once | INTRAMUSCULAR | Status: AC
Start: 2017-06-27 — End: 2017-06-27
  Administered 2017-06-27: 18:00:00 30 mg via INTRAMUSCULAR

## 2017-06-27 MED ORDER — ORPHENADRINE CITRATE 30 MG/ML IJ SOLN
30 MG/ML | Freq: Once | INTRAMUSCULAR | Status: AC
Start: 2017-06-27 — End: 2017-06-27
  Administered 2017-06-27: 18:00:00 60 mg via INTRAMUSCULAR

## 2017-06-27 MED ORDER — ORPHENADRINE CITRATE ER 100 MG PO TB12
100 MG | ORAL_TABLET | Freq: Two times a day (BID) | ORAL | 0 refills | Status: AC
Start: 2017-06-27 — End: 2017-07-07

## 2017-06-27 MED FILL — KETOROLAC TROMETHAMINE 30 MG/ML IJ SOLN: 30 mg/mL | INTRAMUSCULAR | Qty: 1

## 2017-06-27 MED FILL — ORPHENADRINE CITRATE 30 MG/ML IJ SOLN: 30 mg/mL | INTRAMUSCULAR | Qty: 2

## 2017-06-27 NOTE — Discharge Instructions (Signed)
Take home medications as directed.  Follow-up with PCP in 1-2 days.

## 2017-06-27 NOTE — ED Provider Notes (Signed)
[facility] ED  EMERGENCY DEPARTMENT ENCOUNTER      Pt Name: Gary Terry  MRN: [id number]  Birthdate August 05, 1980  Date of evaluation: 06/27/2017  Provider: Maurice Small, DO    CHIEF COMPLAINT       Chief Complaint   Patient presents with   ??? Back Pain     right lower, denies injury, ongoing issue       HISTORY OF PRESENT ILLNESS    Gary Terry is a 37 y.o. male who presents to the emergency department from home with increasing lumbar pain for 1 week.  Patient admits that he does have chronic lower back pain.  He has followed up with a physician in [place] in the past for this back pain.  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  He states he thinks he aggravated his spine working on the roof.  He states, "I was having to reach down and lift a lot!"  Patient states the pain is worse with flexion of the lumbar spine.  Patient denies any loss of bowel function/bladder function, saddle anesthesia, or neuro-focal deficit.    Triage notes and Nursing notes were reviewed by myself.  Any discrepancies are addressed above.    PAST MEDICAL HISTORY     Past Medical History:   Diagnosis Date   ??? Hypertension        SURGICAL HISTORY       Past Surgical History:   Procedure Laterality Date   ??? HEMORRHOID SURGERY         CURRENT MEDICATIONS       Discharge Medication List as of 06/27/2017  2:07 PM      CONTINUE these medications which have NOT CHANGED    Details   ibuprofen (ADVIL;MOTRIN) 800 MG tablet Take 1 tablet by mouth every 8 hours as needed for Pain, Disp-20 tablet, R-0Print      hydrocortisone (ANUSOL-HC) 2.5 % rectal cream Place rectally 2 times daily., Disp-1 Tube, R-0, Print      docusate sodium (COLACE) 100 MG capsule Take 1 capsule by mouth 2 times daily, Disp-12 capsule, R-0Print             ALLERGIES     Tramadol    FAMILY HISTORY     No family history on file.     SOCIAL HISTORY       Social History     Socioeconomic History   ??? Marital status: Single      Spouse name: Not on file   ??? Number of children: Not on file   ??? Years of education: Not on file   ??? Highest education level: Not on file   Occupational History   ??? Not on file   Social Needs   ??? Financial resource strain: Not on file   ??? Food insecurity:     Worry: Not on file     Inability: Not on file   ??? Transportation needs:     Medical: Not on file     Non-medical: Not on file   Tobacco Use   ??? Smoking status: Current Every Day Smoker     Packs/day: 0.50     Types: Cigarettes   Substance and Sexual Activity   ??? Alcohol use: Yes     Comment: weekly   ??? Drug use: Yes     Types: Marijuana     Comment: sometimes   ??? Sexual activity: Not  on file   Lifestyle   ??? Physical activity:     Days per week: Not on file     Minutes per session: Not on file   ??? Stress: Not on file   Relationships   ??? Social connections:     Talks on phone: Not on file     Gets together: Not on file     Attends religious service: Not on file     Active member of club or organization: Not on file     Attends meetings of clubs or organizations: Not on file     Relationship status: Not on file   ??? Intimate partner violence:     Fear of current or ex partner: Not on file     Emotionally abused: Not on file     Physically abused: Not on file     Forced sexual activity: Not on file   Other Topics Concern   ??? Not on file   Social History Narrative   ??? Not on file       REVIEW OF SYSTEMS     Review of Systems   Constitutional: Negative for activity change, diaphoresis and fever.   HENT: Negative for congestion, ear discharge, ear pain and sore throat.    Eyes: Negative for discharge and redness.   Respiratory: Negative for apnea, chest tightness and shortness of breath.    Cardiovascular: Negative for chest pain.   Gastrointestinal: Negative for abdominal pain.   Musculoskeletal: Positive for back pain.   Skin: Negative for pallor and rash.   Neurological: Negative for dizziness, seizures, syncope, facial asymmetry, speech difficulty, weakness and  headaches.   All other systems reviewed and are negative.      Except as noted above the remainder of the review of systems was reviewed and is.   PHYSICAL EXAM    (up to 7 for level 4, 8 or more for level 5)     ED Triage Vitals [06/27/17 1152]   BP Temp Temp src Pulse Resp SpO2 Height Weight   129/82 98 ??F (36.7 ??C) -- 82 19 98 % -- --       Physical Exam   Constitutional: He is oriented to person, place, and time. He appears well-developed and well-nourished.   HENT:   Head: Normocephalic and atraumatic.   Eyes: Pupils are equal, round, and reactive to light. Conjunctivae and EOM are normal.   Neck: Normal range of motion. Neck supple.   Cardiovascular: Normal rate, regular rhythm, normal heart sounds and intact distal pulses.   Pulmonary/Chest: Effort normal and breath sounds normal.   Abdominal: Soft. Bowel sounds are normal.   Musculoskeletal:        Lumbar back: He exhibits decreased range of motion and spasm (para-spinal muscle).   Neurological: He is alert and oriented to person, place, and time.   Cranial nerves II through XII intact.  +5/5 upper extremity upper/lower extremity strength bilaterally.  Normal finger to nose and heel to shin tests.   Skin: Skin is warm and dry.   Nursing note and vitals reviewed.      DIAGNOSTIC RESULTS         RADIOLOGY:   Interpretation per the Radiologist below, if available at the time of this note:    XR LUMBAR SPINE (2-3 VIEWS)   Final Result   1. 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   2. No fracture.  LABS:  Labs Reviewed   URINE RT REFLEX TO CULTURE - Abnormal; Notable for the following components:       Result Value    Specific Gravity, UA <1.005 (*)     All other components within normal limits   MICROSCOPIC URINALYSIS       All other labs were within normal range or not returned as of this dictation.    EMERGENCY DEPARTMENT COURSE andMedical Decision Making:         Patient presents with chief complaint of acute on chronic back  pain.  +2 pulses noted in upper and lower extremities bilaterally.  +5/5 upper and lower extremity strength bilaterally.  Patient denies any loss of bowel or bladder function.   Patient also denies caudal anesthesia and numbness.  Imaging is normal.  I suspect patient does likely have a lower back sprain.  Patient informed to take muscle relaxers, NSAIDS, and Tylenol at home.  Vitals stable.  No fever.  Will d/c to follow-up in 7-10 days with PCP or orthopedic surgery if no resolution of pain.      Strict return precautions and follow up instructions were discussed with the patient with which the patient agrees    ED Medications administered this visit:    Medications   ketorolac (TORADOL) injection 30 mg (30 mg Intramuscular Given 06/27/17 1348)   orphenadrine (NORFLEX) injection 60 mg (60 mg Intramuscular Given 06/27/17 1351)          CLINICAL       1. Strain of lumbar region, initial encounter Stable         DISPOSITION/PLAN   DISPOSITION        PATIENT REFERRED TO:  [facility]  [address]  [place] [id number]  [phone]  Schedule an appointment as soon as possible for a visit in 3 days  If symptoms worsen      DISCHARGE MEDICATIONS:  Discharge Medication List as of 06/27/2017  2:07 PM      START taking these medications    Details   orphenadrine (NORFLEX) 100 MG extended release tablet Take 1 tablet by mouth 2 times daily for 10 days, Disp-20 tablet, R-0Print                    (Please note that portions of this note were completed with a voice recognition program.  Efforts were made to edit the dictations but occasionallywords are mis-transcribed.)      Maurice SmallSCOTT D Kimon Loewen, DO (electronically signed)  Attending Emergency Department Provider     Myna HidalgoScott B Apolonia Ellwood, DO  06/28/17 1415

## 2018-01-22 ENCOUNTER — Emergency Department: Admit: 2018-01-22 | Payer: PRIVATE HEALTH INSURANCE | Primary: Family Medicine

## 2018-01-22 ENCOUNTER — Inpatient Hospital Stay
Admit: 2018-01-22 | Discharge: 2018-01-22 | Disposition: A | Discharge status: Home / Self Care | Payer: PRIVATE HEALTH INSURANCE | Attending: Emergency Medicine

## 2018-01-22 DIAGNOSIS — R0789 Other chest pain: Secondary | ICD-10-CM

## 2018-01-22 LAB — CBC WITH AUTO DIFFERENTIAL
Absolute Eos #: 0.08 10*3/uL (ref 0.00–0.44)
Absolute Immature Granulocyte: 0 10*3/uL (ref 0.00–0.30)
Absolute Lymph #: 2.56 10*3/uL (ref 1.10–3.70)
Absolute Mono #: 0.8 10*3/uL (ref 0.10–1.20)
Basophils Absolute: 0.08 10*3/uL (ref 0.0–0.2)
Basophils: 1 % (ref 0–2)
Eosinophils %: 1 % (ref 1–4)
Hematocrit: 39.9 % — ABNORMAL LOW (ref 40.7–50.3)
Hemoglobin: 12.6 g/dL — ABNORMAL LOW (ref 13.0–17.0)
Immature Granulocytes: 0 %
Lymphocytes: 32 % (ref 24–43)
MCH: 28.6 pg (ref 25.2–33.5)
MCHC: 31.6 g/dL (ref 28.4–34.8)
MCV: 90.7 fL (ref 82.6–102.9)
MPV: 11.2 fL (ref 8.1–13.5)
Monocytes: 10 % (ref 3–12)
Morphology: NORMAL
NRBC Automated: 0 per 100 WBC
Platelets: 209 10*3/uL (ref 138–453)
RBC: 4.4 m/uL (ref 4.21–5.77)
RDW: 12.4 % (ref 11.8–14.4)
Seg Neutrophils: 56 % (ref 36–65)
Segs Absolute: 4.48 10*3/uL (ref 1.50–8.10)
WBC: 8 10*3/uL (ref 3.5–11.3)

## 2018-01-22 LAB — BASIC METABOLIC PANEL
Anion Gap: 14 mmol/L (ref 9–17)
BUN: 7 mg/dL (ref 6–20)
Bun/Cre Ratio: 10 (ref 9–20)
CO2: 26 mmol/L (ref 20–31)
Calcium: 9.6 mg/dL (ref 8.6–10.4)
Chloride: 99 mmol/L (ref 98–107)
Creatinine: 0.68 mg/dL — ABNORMAL LOW (ref 0.70–1.20)
GFR African American: >60 mL/min (ref >60)
GFR Non-African American: >60 mL/min (ref >60)
Glucose: 90 mg/dL (ref 70–99)
Potassium: 4.4 mmol/L (ref 3.7–5.3)
Sodium: 139 mmol/L (ref 135–144)

## 2018-01-22 LAB — TROPONIN
Troponin T: <0.03 ng/mL (ref <0.03)
Troponin T: <0.03 ng/mL (ref <0.03)

## 2018-01-22 LAB — LIPASE: Lipase: 23 U/L (ref 13–60)

## 2018-01-22 NOTE — ED Provider Notes (Signed)
[facility] ED  EMERGENCY DEPARTMENT ENCOUNTER   CHIEF COMPLAINT   Chief Complaint   Patient presents with   ??? Chest Pain     onset one hour PTA, mid sternal      HPI   Gary Terry is a 37 y.o. male who presents with chest pain, onset was an hour ago. The duration had been constant. But then it got better while he was in the ED.  No vomiting . No sweating. no radiaton. Symptoms do not increase with exertion. He has some family history of cardiac issues, he is not a heavy smoker.    REVIEW OF SYSTEMS   Cardiac: +Chest Pain, Denies syncope  Respiratory: Denies cough or hemoptysis  GI: Denies Vomiting or Diarrhea  General: Denies Fever   All other review of systems otherwise negative.   PAST MEDICAL & SURGICAL HISTORY   Past Medical History:   Diagnosis Date   ??? Hypertension      Past Surgical History:   Procedure Laterality Date   ??? HEMORRHOID SURGERY        CURRENT MEDICATIONS   Current Outpatient Rx   Medication Sig Dispense Refill   ??? lisinopril (PRINIVIL;ZESTRIL) 20 MG tablet Take 20 mg by mouth 2 times daily     ??? aspirin 81 MG tablet Take 81 mg by mouth daily     ??? metoprolol succinate (TOPROL XL) 50 MG extended release tablet Take 50 mg by mouth 2 times daily     ??? amLODIPine (NORVASC) 10 MG tablet Take 10 mg by mouth daily     ??? ibuprofen (ADVIL;MOTRIN) 800 MG tablet Take 1 tablet by mouth every 8 hours as needed for Pain 20 tablet 0   ??? hydrocortisone (ANUSOL-HC) 2.5 % rectal cream Place rectally 2 times daily. 1 Tube 0   ??? docusate sodium (COLACE) 100 MG capsule Take 1 capsule by mouth 2 times daily 12 capsule 0      ALLERGIES   Allergies   Allergen Reactions   ??? Tramadol Nausea And Vomiting      SOCIAL & FAMILY HISTORY   Social History     Socioeconomic History   ??? Marital status: Single     Spouse name: None   ??? Number of children: None   ??? Years of education: None   ??? Highest education level: None   Occupational History   ??? None   Social Needs   ??? Financial resource strain: None   ??? Food  insecurity:     Worry: None     Inability: None   ??? Transportation needs:     Medical: None     Non-medical: None   Tobacco Use   ??? Smoking status: Current Every Day Smoker     Packs/day: 0.50     Types: Cigarettes   Substance and Sexual Activity   ??? Alcohol use: Yes     Comment: weekly   ??? Drug use: Yes     Types: Marijuana     Comment: sometimes   ??? Sexual activity: None   Lifestyle   ??? Physical activity:     Days per week: None     Minutes per session: None   ??? Stress: None   Relationships   ??? Social connections:     Talks on phone: None     Gets together: None     Attends religious service: None     Active member of club or organization: None  Attends meetings of clubs or organizations: None     Relationship status: None   ??? Intimate partner violence:     Fear of current or ex partner: None     Emotionally abused: None     Physically abused: None     Forced sexual activity: None   Other Topics Concern   ??? None   Social History Narrative   ??? None     History reviewed. No pertinent family history.   PHYSICAL EXAM   VITAL SIGNS: BP 134/86    Pulse 63    Temp 97.7 ??F (36.5 ??C) (Oral)    Resp 10    Ht 6\' 1"  (1.854 m)    Wt 180 lb (81.6 kg)    SpO2 99%    BMI 23.75 kg/m??    Constitutional: Well developed, well nourished, no acute distress   HENT: Atraumatic, moist mucus membranes  Neck: supple, no JVD   Respiratory: Lungs Clear, no retractions   Cardiovascular: Reg rate and rhythm   Vascular: Radial pulses 2+ equal bilaterally  GI: Soft, nontender, normal bowel sounds  Musculoskeletal: No edema, no deformities  Integument: Skin warm and dry, no petechiae   Neurologic: Alert & oriented, normal speech  Psych: Pleasant affect, no hallucinations       EKG (interpreted by me)  Rhythm: nsr   Rate: 66 BPM  Axis: normal  Conduction: normal  ST/T Segments: no acute change  Clinical Impression: nonspecific      RADIOLOGY/PROCEDURES   XR CHEST PORTABLE   Final Result   Portable chest is within normal limits.           ED  COURSE & MEDICAL DECISION MAKING   Pertinent Labs & Imaging studies reviewed and interpreted. (See chart for details)  See chart for details of medications given during the ED stay.   Vitals:    01/22/18 1820   BP: 134/86   Pulse: 63   Resp: 10   Temp:    SpO2: 99%       Differential Diagnosis: Acute Coronary Syndrome, Congestive Heart Failure, Myocardial Infarction, Pulmonary Embolus, Thoracic Dissection, Pneumonia, Pneumothorax, other.     MDM: Patient without chest pain and with 2 normal troponins and EKGs.  Heart score negative for require admission at this time.  Patient encouraged to follow-up when he is in jail.    FINAL IMPRESSION   1. Chest pain, unspecified type        Plan: back to jail.  Electronically signed by: Waylan Boga, MD, 01/22/2018 6:53 PM  (This note was completed with a voice recognition program)              Waylan Boga, MD  01/22/18 1857

## 2018-01-22 NOTE — ED Notes (Signed)
Bed: 06  Expected date:   Expected time:   Means of arrival:   Comments:  Jail hold     Ofilia Neasrin Kellar Westberg, RN  01/22/18 1520

## 2018-01-23 LAB — EKG 12-LEAD
Atrial Rate: 58 {beats}/min
Atrial Rate: 66 {beats}/min
P Axis: 60 degrees
P Axis: 63 degrees
P-R Interval: 144 ms
P-R Interval: 160 ms
Q-T Interval: 392 ms
Q-T Interval: 402 ms
QRS Duration: 102 ms
QRS Duration: 98 ms
QTc Calculation (Bazett): 394 ms
QTc Calculation (Bazett): 410 ms
R Axis: 30 degrees
R Axis: 30 degrees
T Axis: 31 degrees
T Axis: 34 degrees
Ventricular Rate: 58 {beats}/min
Ventricular Rate: 66 {beats}/min

## 2019-06-10 ENCOUNTER — Encounter

## 2019-06-10 ENCOUNTER — Inpatient Hospital Stay: Admit: 2019-06-10 | Payer: MEDICAID | Primary: Family Medicine

## 2019-06-10 ENCOUNTER — Inpatient Hospital Stay: Payer: MEDICAID | Primary: Family Medicine

## 2019-06-10 DIAGNOSIS — M1990 Unspecified osteoarthritis, unspecified site: Secondary | ICD-10-CM

## 2019-06-10 DIAGNOSIS — Z0001 Encounter for general adult medical examination with abnormal findings: Secondary | ICD-10-CM

## 2019-06-10 LAB — BWW HEMOGLOBIN A1C: Hemoglobin A1C: 5.6 % (ref 4.2–6.3)

## 2019-06-10 LAB — CBC WITH AUTO DIFFERENTIAL
Basophils %: 0.4 % (ref 0–1)
Basophils Absolute: 0 10*3/uL
Eosinophils %: 0.7 % (ref 0–3)
Eosinophils Absolute: 0.1 10*3/uL
Hematocrit: 39.2 % — ABNORMAL LOW (ref 42–52)
Hemoglobin: 12.8 GM/DL — ABNORMAL LOW (ref 13.5–18.0)
Immature Neutrophil %: 0.2 % (ref 0–0.43)
Lymphocytes %: 23.3 % — ABNORMAL LOW (ref 24–44)
Lymphocytes Absolute: 2.2 10*3/uL
MCH: 30.7 PG (ref 27–31)
MCHC: 32.7 % (ref 32.0–36.0)
MCV: 94 FL (ref 78–100)
MPV: 10.1 FL (ref 7.5–11.1)
Monocytes %: 12.1 % — ABNORMAL HIGH (ref 0–4)
Neutrophils Absolute: 6 10*3/uL
Nucleated RBC %: 0 %
Platelets: 215 10*3/uL (ref 140–440)
RBC: 4.17 10*6/uL — ABNORMAL LOW (ref 4.6–6.2)
RDW: 16.9 % — ABNORMAL HIGH (ref 11.7–14.9)
Segs Relative: 63.3 % (ref 36–66)
Total Immature Neutrophil: 0.02 10*3/uL
Total Nucleated RBC: 0 10*3/uL
WBC: 9.5 10*3/uL (ref 4.0–10.5)

## 2019-06-11 LAB — COMPREHENSIVE METABOLIC PANEL
ALT: 15 U/L (ref 10–40)
AST: 27 IU/L (ref 15–37)
Albumin: 4.2 GM/DL (ref 3.4–5.0)
Alkaline Phosphatase: 109 IU/L (ref 40–128)
Anion Gap: 12 (ref 4–16)
BUN: 5 MG/DL — ABNORMAL LOW (ref 6–23)
CO2: 25 MMOL/L (ref 21–32)
Calcium: 9 MG/DL (ref 8.3–10.6)
Chloride: 101 mMol/L (ref 99–110)
Creatinine: 0.7 MG/DL — ABNORMAL LOW (ref 0.9–1.3)
GFR African American: >60 mL/min/{1.73_m2} (ref >60)
GFR Non-African American: >60 mL/min/{1.73_m2} (ref >60)
Glucose: 90 MG/DL (ref 70–99)
Potassium: 4.4 MMOL/L (ref 3.5–5.1)
Sodium: 138 MMOL/L (ref 135–145)
Total Bilirubin: 0.3 MG/DL (ref 0.0–1.0)
Total Protein: 8.2 GM/DL (ref 6.4–8.2)

## 2019-06-11 LAB — MAGNESIUM: Magnesium: 2.2 mg/dl (ref 1.8–2.4)

## 2019-06-11 LAB — SEDIMENTATION RATE: Sed Rate: 75 MM/HR — ABNORMAL HIGH (ref 0–15)

## 2019-06-11 LAB — LIPID PANEL
Cholesterol: 208 MG/DL — ABNORMAL HIGH (ref <200)
HDL: 84 MG/DL (ref >40)
LDL Direct: 112 MG/DL — ABNORMAL HIGH (ref <100)
Triglycerides: 102 MG/DL (ref <150)

## 2019-06-11 LAB — TSH: TSH, High Sensitivity: 1.48 u[IU]/mL (ref 0.270–4.20)

## 2019-06-11 LAB — PSA SCREENING: PSA: 0.9 NG/ML (ref 0–4.0)

## 2019-06-11 LAB — T4, FREE: T4 Free: 0.96 NG/DL (ref 0.9–1.8)

## 2019-06-23 ENCOUNTER — Inpatient Hospital Stay: Payer: MEDICAID | Primary: Family Medicine

## 2019-06-23 DIAGNOSIS — D649 Anemia, unspecified: Secondary | ICD-10-CM

## 2019-06-24 LAB — IRON AND TIBC
Iron: 85 ug/dL (ref 59–158)
TIBC: 313 ug/dL (ref 250–450)
Transferrin %: 27 % (ref 10–44)
UIBC: 228 ug/dL (ref 110–370)

## 2019-06-24 LAB — FERRITIN: Ferritin: 139 NG/ML (ref 30–400)

## 2019-07-31 ENCOUNTER — Emergency Department: Admit: 2019-08-01 | Payer: MEDICAID | Primary: Family Medicine

## 2019-07-31 DIAGNOSIS — K2900 Acute gastritis without bleeding: Secondary | ICD-10-CM

## 2019-07-31 NOTE — ED Provider Notes (Signed)
Emergency Department Encounter    Patient: Gary Terry  MRN: [id number]  DOB: 09-Sep-1980  Date of Evaluation: 08/01/2019  ED Provider:  [place]      Triage Chief Complaint:   Abdominal Pain (3 days) and Emesis      HOPI:  Gary Terry is a 39 y.o. male that presents to the emergency department with with generalized abdominal pain beginning 4 or 5 days ago.  Patient indicates the periumbilical region.  Pain is generally aching in quality.  Pain has become constant in timing.  It does not further radiate or migrate.  He does drink "a couple beers" per day.  Significant other reports that he is recently stopped drinking however.  He denies frequent use of NSAIDs.  He denies known history of pancreatitis or hepatitis.  He denies any diarrhea, blood in the stool, or fevers.  He has noted some chills and sweats.  He denies any known COVID-19 exposure.  He denies any urinary symptoms.  Patient reports no other particular provocative or alleviating factors.    ROS - see HPI, below listed is current ROS at time of my eval:  CONSTITUTIONAL: No fevers, chills, or sweats.  EYES: No vision change, redness, drainage, or discharge.  HENT: No sore throat, runny nose, or earache.  No dental pain.  No painful swallowing.  RESPIRATORY: No difficulty breathing, cough, or sputum production.  CARDIOVASCULAR: No anginal-type chest pain, orthopnea, or edema.  GASTROINTESTINAL: No hematemesis, hematochezia, or melena.  GENITOURINARY: No frequency, urgency, or dysuria.  No hematuria.  MUSCULOSKELETAL: No recent injury.  No neck, back, or extremity pain.  NEUROLOGICAL: No focal weakness, numbness, or tingling.  SKIN: No rashes or other lesions reported.  No yellowing of the skin.      Past Medical History:   Diagnosis Date   ??? Hypertension      Past Surgical History:   Procedure Laterality Date   ??? HEMORRHOID SURGERY       History reviewed. No pertinent family history.  Social History     Socioeconomic History   ??? Marital status: Single      Spouse name: Not on file   ??? Number of children: Not on file   ??? Years of education: Not on file   ??? Highest education level: Not on file   Occupational History   ??? Not on file   Social Needs   ??? Financial resource strain: Not on file   ??? Food insecurity     Worry: Not on file     Inability: Not on file   ??? Transportation needs     Medical: Not on file     Non-medical: Not on file   Tobacco Use   ??? Smoking status: Current Every Day Smoker     Packs/day: 0.50     Types: Cigarettes   Substance and Sexual Activity   ??? Alcohol use: Yes     Comment: weekly   ??? Drug use: Yes     Types: Marijuana     Comment: sometimes   ??? Sexual activity: Not on file   Lifestyle   ??? Physical activity     Days per week: Not on file     Minutes per session: Not on file   ??? Stress: Not on file   Relationships   ??? Social Product manager on phone: Not on file     Gets together: Not on file     Attends religious service: Not  on file     Active member of club or organization: Not on file     Attends meetings of clubs or organizations: Not on file     Relationship status: Not on file   ??? Intimate partner violence     Fear of current or ex partner: Not on file     Emotionally abused: Not on file     Physically abused: Not on file     Forced sexual activity: Not on file   Other Topics Concern   ??? Not on file   Social History Narrative   ??? Not on file     No current facility-administered medications for this encounter.      Current Outpatient Medications   Medication Sig Dispense Refill   ??? famotidine (PEPCID) 20 MG tablet Take 1 tablet by mouth 2 times daily for 3 days Then daily as needed. 30 tablet 1   ??? omeprazole (PRILOSEC) 20 MG delayed release capsule Take 1 capsule by mouth daily 30 capsule 1   ??? ondansetron (ZOFRAN-ODT) 4 MG disintegrating tablet Take 1 tablet by mouth 3 times daily as needed for Nausea or Vomiting 21 tablet 0   ??? HYDROcodone-acetaminophen (NORCO) 5-325 MG per tablet Take 1 tablet by mouth every 8 hours as needed for  Pain for up to 3 days. Intended supply: 3 days. Take lowest dose possible to manage pain 9 tablet 0   ??? lisinopril (PRINIVIL;ZESTRIL) 20 MG tablet Take 20 mg by mouth 2 times daily     ??? aspirin 81 MG tablet Take 81 mg by mouth daily     ??? metoprolol succinate (TOPROL XL) 50 MG extended release tablet Take 50 mg by mouth 2 times daily     ??? amLODIPine (NORVASC) 10 MG tablet Take 10 mg by mouth daily     ??? ibuprofen (ADVIL;MOTRIN) 800 MG tablet Take 1 tablet by mouth every 8 hours as needed for Pain 20 tablet 0   ??? hydrocortisone (ANUSOL-HC) 2.5 % rectal cream Place rectally 2 times daily. 1 Tube 0   ??? docusate sodium (COLACE) 100 MG capsule Take 1 capsule by mouth 2 times daily 12 capsule 0     Allergies   Allergen Reactions   ??? Tramadol Nausea And Vomiting       Nursing Notes Reviewed    Physical Exam:  Triage VS:    ED Triage Vitals   Enc Vitals Group      BP 07/31/19 2203 (!) 175/104      Pulse 07/31/19 2203 86      Resp 07/31/19 2203 16      Temp 07/31/19 2200 97.8 ??F (36.6 ??C)      Temp src --       SpO2 07/31/19 2200 98 %      Weight 07/31/19 2200 180 lb (81.6 kg)      Height 07/31/19 2200 6\' 1"  (1.854 m)      Head Circumference --       Peak Flow --       Pain Score --       Pain Loc --       Pain Edu? --       Excl. in GC? --        My pulse ox interpretation is -normal on room air    GENERAL: Patient is awake, alert, and oriented appropriately.  Patient is resting comfortably in a still position on the exam table.  Patient speaking in full  and complete sentences.  Well-nourished and well-developed.    HEENT: Normocephalic and atraumatic.  Pupils equal, round, and reactive to light.  No redness or matting.  Bilateral external ears are unremarkable.  Tympanic membranes are pearly and gray without visible effusion or retraction.  Nasal mucosa is pink without purulence.  Oral mucosa is moist and pink.  There is no significant tonsillar enlargement or exudate.  Uvula midline.  There is no elevation of the tongue  or pooling of secretions.    NECK: Supple with normal range of motion. No Kernig's or Brudzinski signs.  No visible JVD.    RESPIRATORY: Symmetric aeration bilaterally.  No audible wheezes, rales, rhonchi, or stridor.  No chest wall tenderness.    CARDIOVASCULAR: Regular rate and rhythm.  No audible murmurs, rubs, or gallops.  No central or peripheral cyanosis.    GASTROINTESTINAL: Generalized periumbilical tenderness with no McBurney's or Murphy's point tenderness.  Facial grimace with no guarding, rebound, rigidity.  No mass or pulsatile mass.  Bowel sounds are present in all quadrants.  No costovertebral angle tenderness.    NEUROLOGICAL: Awake, alert and oriented x 3.  Cranial nerves III through XII are grossly intact as tested without facial droop or dermatomal paresthesias.  Of note, forehead wrinkles are symmetric and intact.  Conjugate gaze without entrapment.  No asymmetry of the corners of the mouth or nasolabial folds.  No gross motor or cerebellar deficits.    MUSCULOSKELETAL: No asymmetric edema, Homans' sign, or cords.  No tenderness or limitation range of motion to the bilateral shoulders, elbows, wrists, hips, knees, or ankles.  No accompanying long bone tenderness or deformity.    SKIN: Normal tone for ethnicity.  Normal turgor and brisk capillary refill peripherally.  No petechiae, purpura, vesicles, bullae, or other lesions.  No icterus.    PSYCHIATRIC: Normal mood.  Normal affect.  No voiced suicidal or homicidal ideation.  Patient does not respond to internal stimuli.    Emergency department course.  Patient is brought to bed 2 and assessed and reassessed by me.  After initial evaluation, orders are placed for medical screening studies including CBC, metabolic panel, lipase, and urinalysis among others.  CT scan of the abdomen pelvis is ordered.  Normal saline 1 L boluses ordered along with ondansetron 4 mg.  Patient is agreeable to continuing plan.  Shortly after, laboratory reports mild  hyperkalemia at 5.2, but there is reported hemolysis.  I do not believe that redraw is necessary at this time.  Upon most recent reevaluation, patient remains clinically stable.  He reports continuing discomfort, but abdomen has been nonsurgical.  CT scan is most consistent with gastritis.  There is no clear evidence of ulcer formation or perforation.  There is no evidence of alternative diagnosis such as acute or gangrenous cholecystitis, appendicitis, ischemia, obstruction, obstructive uropathy, basilar pneumonia, or other definitive pathology.  There is leukocytosis, but there is no evidence of an infectious process currently.  Patient does not appear clinically septic.  Renal and hepatic functions are intact.  There is hyperkalemia reported, but this likely has to do with hemolysis of the sample.  We have discussed options for disposition, and there does not appear to be indication for transfer to another facility for admission.  We have discussed trial of H2 blocker and proton pump inhibitor along with follow-up with primary care and gastroenterologist. We have discussed all available results.  Patient is satisfied with evaluation and agreeable to recommendations.  Patient has had the opportunity to  ask questions, and they have been answered to the best of my ability.  Instructions are given to follow-up with primary care provider for reevaluation and further testing.  Very strict return and follow-up instructions are provided.      Patient seen during Covid19 Pandemic, I did don appropriate PPE during my encounters with the patient, including n95 (when appropriate) mask and eye protection as appropriate.    I have reviewed and interpreted all of the currently available lab results from this visit (if applicable):  Results for orders placed or performed during the hospital encounter of 07/31/19   CBC Auto Differential   Result Value Ref Range    WBC 16.2 (H) 4.0 - 10.5 K/CU MM    RBC 4.29 (L) 4.6 - 6.2 M/CU MM     Hemoglobin 13.1 (L) 13.5 - 18.0 GM/DL    Hematocrit 16.1 (L) 42 - 52 %    MCV 93.5 78 - 100 FL    MCH 30.5 27 - 31 PG    MCHC 32.7 32.0 - 36.0 %    RDW 13.9 11.7 - 14.9 %    Platelets 290 140 - 440 K/CU MM    MPV 11.3 (H) 7.5 - 11.1 FL    Differential Type AUTOMATED DIFFERENTIAL     Segs Relative 82.8 (H) 36 - 66 %    Lymphocytes % 9.7 (L) 24 - 44 %    Monocytes % 6.5 (H) 0 - 4 %    Eosinophils % 0.3 0 - 3 %    Basophils % 0.3 0 - 1 %    Segs Absolute 13.4 K/CU MM    Lymphocytes Absolute 1.6 K/CU MM    Monocytes Absolute 1.1 K/CU MM    Eosinophils Absolute 0.1 K/CU MM    Basophils Absolute 0.1 K/CU MM    Immature Neutrophil % 0.4 0 - 0.43 %    Total Immature Neutrophil 0.06 K/CU MM   Basic Metabolic Panel w/ Reflex to MG   Result Value Ref Range    Sodium 136 135 - 145 MMOL/L    Potassium 5.2 (H) 3.5 - 5.1 MMOL/L    Chloride 101 99 - 110 mMol/L    CO2 26 21 - 32 MMOL/L    Anion Gap 9 4 - 16    BUN 6 6 - 23 MG/DL    CREATININE 0.6 (L) 0.9 - 1.3 MG/DL    Glucose 096 (H) 70 - 99 MG/DL    Calcium 8.9 8.3 - 04.5 MG/DL    GFR Non-African American >60 >60 mL/min/1.50m2    GFR African American >60 >60 mL/min/1.92m2   Hepatic Function Panel   Result Value Ref Range    Albumin 3.4 3.4 - 5.0 GM/DL    Total Bilirubin 0.2 0.0 - 1.0 MG/DL    Bilirubin, Direct 0.2 0.0 - 0.3 MG/DL    Bilirubin, Indirect 0.0 0 - 0.7 MG/DL    Alkaline Phosphatase 87 40 - 129 IU/L    AST 20 15 - 37 IU/L    ALT 7 (L) 10 - 40 U/L    Total Protein 7.4 6.4 - 8.2 GM/DL   Lipase   Result Value Ref Range    Lipase 22 13 - 60 IU/L   Urinalysis, reflex to microscopic   Result Value Ref Range    Color, UA YELLOW YELLOW    Clarity, UA CLEAR CLEAR    Glucose, Urine NEGATIVE NEGATIVE MG/DL    Bilirubin Urine NEGATIVE NEGATIVE MG/DL  Ketones, Urine NEGATIVE NEGATIVE MG/DL    Specific Gravity, UA 1.015 1.001 - 1.035    Blood, Urine NEGATIVE NEGATIVE    pH, Urine 7.0 5.0 - 8.0    Protein, UA 30 (A) NEGATIVE MG/DL    Urobilinogen, Urine 0.2 0.2 - 1.0 MG/DL     Nitrite Urine, Quantitative NEGATIVE NEGATIVE    Leukocyte Esterase, Urine NEGATIVE NEGATIVE    RBC, UA 1 0 - 3 /HPF    WBC, UA 1 0 - 2 /HPF    Epithelial Cells, UA 2 /HPF    Cast Type NO CAST FORMS SEEN NO CAST FORMS SEEN /HPF    Bacteria, UA NEGATIVE NEGATIVE /HPF    Crystal Type NEGATIVE NEGATIVE /HPF   Ethanol   Result Value Ref Range    Alcohol Scrn <0.01 <0.01 %WT/VOL   Magnesium   Result Value Ref Range    Magnesium 2.1 1.8 - 2.4 mg/dl        Radiographs (if obtained):  Radiologist's Report Reviewed:  Ct Abdomen Pelvis W Iv Contrast Additional Contrast? None    Result Date: 08/01/2019  EXAMINATION: CT OF THE ABDOMEN AND PELVIS WITH CONTRAST 07/31/2019 11:52 pm TECHNIQUE: CT of the abdomen and pelvis was performed with the administration of intravenous contrast. Multiplanar reformatted images are provided for review. Dose modulation, iterative reconstruction, and/or weight based adjustment of the mA/kV was utilized to reduce the radiation dose to as low as reasonably achievable. COMPARISON: None. HISTORY: ORDERING SYSTEM PROVIDED HISTORY: Periumbilical abdominal pain, rule out acute or gangrenous cholecystitis or appendicitis, recurrent nausea and poor appetite TECHNOLOGIST PROVIDED HISTORY: Additional Contrast?->None Reason for exam:->Periumbilical abdominal pain, rule out acute or gangrenous cholecystitis or appendicitis, recurrent nausea and poor appetite Decision Support Exception->Emergency Medical Condition (MA) Reason for Exam: Periumbilical abdominal pain, rule out acute or gangrenous cholecystitis or appendicitis, recurrent nausea and poor appetite Acuity: Acute Type of Exam: Initial FINDINGS: Lower Chest: Linear opacities in the lung bases are noted, which may represent subsegmental atelectasis.. Organs: Indeterminate hypoattenuating liver lesion in the right hepatic lobe measuring 12 mm.  The gallbladder, pancreas, spleen, adrenals and kidneys reveal no acute findings.  0.6 cm hypoattenuating lesion  in the lower pole the right kidney. GI/Bowel: Diffuse gastric mucosal fold thickening is noted.  No surrounding inflammatory change.  The small and large bowel otherwise reveal no acute findings.  Mild stool burden throughout the colon.  No evidence for acute appendicitis. Pelvis: Trace pelvic free fluid. Peritoneum/Retroperitoneum: No free air or free fluid.  The aorta is normal in caliber.  The visceral branches are patent.  Mildly enlarged bilateral inguinal lymph nodes, nonspecific.  Few small lymph nodes in the gastrohepatic ligament are noted.. Bones/Soft Tissues: No abnormality identified.     1.  Diffuse mucosal thickening in the stomach.  This is a nonspecific finding that can be seen with inflammatory conditions and hyperplasia.  A neoplastic process is considered less likely.  Further evaluation with endoscopy is recommended. 2.  No evidence for acute cholecystitis or appendicitis. 3.  Mildly enlarged gastrohepatic ligament lymph nodes and bilateral inguinal lymph nodes.  These may be reactive in etiology.  Underlying neoplastic disease is not excluded. 4.  Indeterminate hypoattenuating liver lesion and right hepatic lobe lesion. If further evaluation is necessary, contrast-enhanced MRI could provide more information.    Medical decision making:  Patient presents to the emergency department with persistent epigastric pain likely due to significant gastritis seen on CT scan.  There is no clear evidence of ulcer  formation, though we have discussed that this is not the preferred test.  There is no evidence of perforation, ischemia, obstruction, pancreatitis, phlegmon, abscess, appendicitis, or other definitive etiology.  There is leukocytosis, but there is no evidence of focal infection currently.  Patient is afebrile and exhibits no hemodynamic instability.  There has been no intractable vomiting.  There has been no respiratory distress, hypoxia, or cyanosis.  Significant other reports that he has stopped  drinking recently.  He is a encouraged to continue with abstinence and to avoid NSAIDs to help with healing of the gastritis.    Procedures: None.    Consultations: None.    Clinical Impression:  1. Acute gastritis without hemorrhage, unspecified gastritis type    2. Abdominal pain, epigastric      Disposition referral (if applicable):  Tobin Chad, MD  [address]  [address] [place] [address]  [phone]    Schedule an appointment as soon as possible for a visit   For primary care    Jerelene Redden, MD  [address] [place] [address]  [phone]    Schedule an appointment as soon as possible for a visit   For gastroenterology    [facility]  [address]  [place] [address]  [phone]  Go to   As needed, If symptoms worsen    Disposition medications (if applicable):  Discharge Medication List as of 08/01/2019 12:50 AM      START taking these medications    Details   famotidine (PEPCID) 20 MG tablet Take 1 tablet by mouth 2 times daily for 3 days Then daily as needed., Disp-30 tablet, R-1Normal      omeprazole (PRILOSEC) 20 MG delayed release capsule Take 1 capsule by mouth daily, Disp-30 capsule, R-1Normal      ondansetron (ZOFRAN-ODT) 4 MG disintegrating tablet Take 1 tablet by mouth 3 times daily as needed for Nausea or Vomiting, Disp-21 tablet, R-0Normal      HYDROcodone-acetaminophen (NORCO) 5-325 MG per tablet Take 1 tablet by mouth every 8 hours as needed for Pain for up to 3 days. Intended supply: 3 days. Take lowest dose possible to manage pain, Disp-9 tablet, R-0Normal           ED Provider Disposition Time  DISPOSITION        Comment: Please note this report has been produced using speech recognition software and may contain errors related to that system including errors in grammar, punctuation, and spelling, as well as words and phrases that may be inappropriate.  Efforts were made to edit the dictations.        Amanda Pea, MD  08/01/19 [phone]

## 2019-08-01 ENCOUNTER — Inpatient Hospital Stay
Admit: 2019-08-01 | Discharge: 2019-08-01 | Disposition: A | Discharge status: Home / Self Care | Payer: MEDICAID | Attending: Emergency Medicine

## 2019-08-01 LAB — CBC WITH AUTO DIFFERENTIAL
Basophils %: 0.3 % (ref 0–1)
Basophils Absolute: 0.1 10*3/uL
Eosinophils %: 0.3 % (ref 0–3)
Eosinophils Absolute: 0.1 10*3/uL
Hematocrit: 40.1 % — ABNORMAL LOW (ref 42–52)
Hemoglobin: 13.1 GM/DL — ABNORMAL LOW (ref 13.5–18.0)
Immature Neutrophil %: 0.4 % (ref 0–0.43)
Lymphocytes %: 9.7 % — ABNORMAL LOW (ref 24–44)
Lymphocytes Absolute: 1.6 10*3/uL
MCH: 30.5 PG (ref 27–31)
MCHC: 32.7 % (ref 32.0–36.0)
MCV: 93.5 FL (ref 78–100)
MPV: 11.3 FL — ABNORMAL HIGH (ref 7.5–11.1)
Monocytes %: 6.5 % — ABNORMAL HIGH (ref 0–4)
Monocytes Absolute: 1.1 10*3/uL
Platelets: 290 10*3/uL (ref 140–440)
RBC: 4.29 10*6/uL — ABNORMAL LOW (ref 4.6–6.2)
RDW: 13.9 % (ref 11.7–14.9)
Segs Absolute: 13.4 10*3/uL
Segs Relative: 82.8 % — ABNORMAL HIGH (ref 36–66)
Total Immature Neutrophil: 0.06 10*3/uL
WBC: 16.2 10*3/uL — ABNORMAL HIGH (ref 4.0–10.5)

## 2019-08-01 LAB — URINALYSIS
Bacteria, UA: NEGATIVE /HPF
Bilirubin Urine: NEGATIVE MG/DL
Blood, Urine: NEGATIVE
Cast Type: NONE SEEN /HPF
Crystal Type: NEGATIVE /HPF
Epithelial Cells, UA: 2 /HPF
Glucose, Urine: NEGATIVE MG/DL
Ketones, Urine: NEGATIVE MG/DL
Leukocyte Esterase, Urine: NEGATIVE
Nitrite Urine, Quantitative: NEGATIVE
Protein, UA: 30 MG/DL — AB
RBC, UA: 1 /HPF (ref 0–3)
Specific Gravity, UA: 1.015 (ref 1.001–1.035)
Urobilinogen, Urine: 0.2 MG/DL (ref 0.2–1.0)
WBC, UA: 1 /HPF (ref 0–2)
pH, Urine: 7 (ref 5.0–8.0)

## 2019-08-01 LAB — BASIC METABOLIC PANEL W/ REFLEX TO MG FOR LOW K
Anion Gap: 9 (ref 4–16)
BUN: 6 MG/DL (ref 6–23)
CO2: 26 MMOL/L (ref 21–32)
Calcium: 8.9 MG/DL (ref 8.3–10.6)
Chloride: 101 mMol/L (ref 99–110)
Creatinine: 0.6 MG/DL — ABNORMAL LOW (ref 0.9–1.3)
GFR African American: >60 mL/min/{1.73_m2} (ref >60)
GFR Non-African American: >60 mL/min/{1.73_m2} (ref >60)
Glucose: 114 MG/DL — ABNORMAL HIGH (ref 70–99)
Potassium: 5.2 MMOL/L — ABNORMAL HIGH (ref 3.5–5.1)
Sodium: 136 MMOL/L (ref 135–145)

## 2019-08-01 LAB — MAGNESIUM: Magnesium: 2.1 mg/dl (ref 1.8–2.4)

## 2019-08-01 LAB — ETHANOL: Alcohol Scrn: <0.01 %WT/VOL (ref <0.01)

## 2019-08-01 LAB — HEPATIC FUNCTION PANEL
ALT: 7 U/L — ABNORMAL LOW (ref 10–40)
AST: 20 IU/L (ref 15–37)
Albumin: 3.4 GM/DL (ref 3.4–5.0)
Alkaline Phosphatase: 87 IU/L (ref 40–129)
Bilirubin, Direct: 0.2 MG/DL (ref 0.0–0.3)
Bilirubin, Indirect: 0 MG/DL (ref 0–0.7)
Total Bilirubin: 0.2 MG/DL (ref 0.0–1.0)
Total Protein: 7.4 GM/DL (ref 6.4–8.2)

## 2019-08-01 LAB — LIPASE: Lipase: 22 IU/L (ref 13–60)

## 2019-08-01 MED ORDER — PANTOPRAZOLE SODIUM 40 MG IV SOLR
40 MG | Freq: Once | INTRAVENOUS | Status: AC
Start: 2019-08-01 — End: 2019-08-01
  Administered 2019-08-01: 05:00:00 40 mg via INTRAVENOUS

## 2019-08-01 MED ORDER — ONDANSETRON 4 MG PO TBDP
4 MG | ORAL_TABLET | Freq: Three times a day (TID) | ORAL | 0 refills | Status: AC | PRN
Start: 2019-08-01 — End: ?

## 2019-08-01 MED ORDER — IOPAMIDOL 76 % IV SOLN
76 % | Freq: Once | INTRAVENOUS | Status: AC | PRN
Start: 2019-08-01 — End: 2019-08-01
  Administered 2019-08-01: 04:00:00 75 mL via INTRAVENOUS

## 2019-08-01 MED ORDER — HYDROCODONE-ACETAMINOPHEN 5-325 MG PO TABS
5-325 MG | ORAL_TABLET | Freq: Three times a day (TID) | ORAL | 0 refills | Status: AC | PRN
Start: 2019-08-01 — End: 2019-08-04

## 2019-08-01 MED ORDER — FAMOTIDINE 20 MG/2ML IV SOLN
20 MG/2ML | Freq: Once | INTRAVENOUS | Status: AC
Start: 2019-08-01 — End: 2019-08-01
  Administered 2019-08-01: 05:00:00 20 mg via INTRAVENOUS

## 2019-08-01 MED ORDER — ONDANSETRON HCL 4 MG/2ML IJ SOLN
4 MG/2ML | Freq: Once | INTRAMUSCULAR | Status: AC
Start: 2019-08-01 — End: 2019-07-31
  Administered 2019-08-01: 02:00:00 4 mg via INTRAVENOUS

## 2019-08-01 MED ORDER — SODIUM CHLORIDE 0.9 % IV BOLUS
0.9 | Freq: Once | INTRAVENOUS | Status: AC
Start: 2019-08-01 — End: 2019-07-31
  Administered 2019-08-01: 02:00:00 1000 mL via INTRAVENOUS

## 2019-08-01 MED ORDER — MORPHINE SULFATE (PF) 4 MG/ML IJ SOLN
4 MG/ML | Freq: Once | INTRAMUSCULAR | Status: AC
Start: 2019-08-01 — End: 2019-08-01
  Administered 2019-08-01: 05:00:00 4 mg via INTRAVENOUS

## 2019-08-01 MED ORDER — FAMOTIDINE 20 MG PO TABS
20 MG | ORAL_TABLET | Freq: Two times a day (BID) | ORAL | 1 refills | Status: AC
Start: 2019-08-01 — End: 2019-08-04

## 2019-08-01 MED ORDER — OMEPRAZOLE 20 MG PO CPDR
20 MG | ORAL_CAPSULE | Freq: Every day | ORAL | 1 refills | Status: AC
Start: 2019-08-01 — End: ?

## 2019-08-01 MED FILL — ONDANSETRON HCL 4 MG/2ML IJ SOLN: 4 MG/2ML | INTRAMUSCULAR | Qty: 2

## 2019-08-01 MED FILL — MORPHINE SULFATE 4 MG/ML IJ SOLN: 4 mg/mL | INTRAMUSCULAR | Qty: 1

## 2019-08-01 MED FILL — FAMOTIDINE 20 MG/2ML IV SOLN: 20 MG/2ML | INTRAVENOUS | Qty: 2

## 2019-08-01 MED FILL — PROTONIX 40 MG IV SOLR: 40 mg | INTRAVENOUS | Qty: 40

## 2019-08-01 MED FILL — SODIUM CHLORIDE 0.9 % IV SOLN: 0.9 % | INTRAVENOUS | Qty: 1000

## 2019-08-01 MED FILL — ISOVUE-370 76 % IV SOLN: 76 % | INTRAVENOUS | Qty: 100

## 2019-08-01 NOTE — Discharge Instructions (Signed)
Return at anytime for worsening or changing pain, fevers not improved with ibuprofen and acetaminophen, uncontrolled vomiting, chest or abdominal pain, difficulty breathing, weakness, numbness, tingling, unusual behavior, or any other problems or concerns that may occur.    Follow-up with your primary care doctor as discussed for a recheck and possible additional outpatient testing, such as endoscopy to look for healing of the gastritis or any ulcers.      Call your primary care doctor or any specialist to arrange an appointment.  Please do not rely on the doctor's office to call you.    Prescriptions have been delivered electronically to your preferred pharmacy on record, [facility] in [place].    Consider a continuing trial of a fast acting antacid such as famotidine/Pepcid twice daily for the next 2 or 3 days and then daily thereafter along with a longer acting antacid such as omeprazole/Prilosec.  Continue your regularly prescribed medications.    Alternate hydrocodone-acetaminophen or plain acetaminophen every 4 hours for fever and discomfort.  Be aware that NSAIDs such as aspirin, ibuprofen, Motrin, Advil, naproxen, and Aleve can make acid reflux and gastritis worse.    Limit or avoid alcohol to help with recovery of the stomach.    Clear liquids for 24 hours, and then [place] including Bananas, Rice, Applesauce, and Toast.    Thank you for choosing [facility] to provide your emergency care.  You may receive a patient satisfaction survey and follow-up to your visit.  This is your time to provide [place] with genuine feedback that can better improve processes for the next patient.  We aim to provide our patients with safe and excellent care.  If you were satisfied with your care today, tell everyone.  If you were not satisfied with your care today, tell us while you are still here so that we can improve your experience.    Thank you,  Your Board-Certified Emergency Medicine  Physician,  [facility]    Dr. Amanda Pea, M. D.

## 2019-08-05 ENCOUNTER — Inpatient Hospital Stay: Payer: MEDICAID | Primary: Family Medicine

## 2019-08-05 DIAGNOSIS — L309 Dermatitis, unspecified: Secondary | ICD-10-CM

## 2019-08-05 LAB — CBC WITH AUTO DIFFERENTIAL
Eosinophils %: 2 % (ref 0–3)
Eosinophils Absolute: 0.3 10*3/uL
Hematocrit: 37.6 % — ABNORMAL LOW (ref 42–52)
Hemoglobin: 12 GM/DL — ABNORMAL LOW (ref 13.5–18.0)
Lymphocytes %: 14 % — ABNORMAL LOW (ref 24–44)
Lymphocytes Absolute: 1.9 10*3/uL
MCH: 30.8 PG (ref 27–31)
MCHC: 31.9 % — ABNORMAL LOW (ref 32.0–36.0)
MCV: 96.4 FL (ref 78–100)
MPV: 10.9 FL (ref 7.5–11.1)
Monocytes %: 4 % (ref 0–4)
Monocytes Absolute: 0.5 10*3/uL
Platelets: 307 10*3/uL (ref 140–440)
RBC: 3.9 10*6/uL — ABNORMAL LOW (ref 4.6–6.2)
RDW: 14.2 % (ref 11.7–14.9)
Segs Absolute: 10.6 10*3/uL
Segs Relative: 80 % — ABNORMAL HIGH (ref 36–66)
WBC: 13.3 10*3/uL — ABNORMAL HIGH (ref 4.0–10.5)
